# Patient Record
Sex: Female | Born: 1965
Health system: Southern US, Community
[De-identification: ages and names within clinical notes are randomized; demographics above are authoritative.]

## PROBLEM LIST (undated history)

## (undated) DIAGNOSIS — R112 Nausea with vomiting, unspecified: Secondary | ICD-10-CM

## (undated) DIAGNOSIS — N189 Chronic kidney disease, unspecified: Secondary | ICD-10-CM

## (undated) DIAGNOSIS — M810 Age-related osteoporosis without current pathological fracture: Secondary | ICD-10-CM

## (undated) DIAGNOSIS — K649 Unspecified hemorrhoids: Secondary | ICD-10-CM

## (undated) DIAGNOSIS — R350 Frequency of micturition: Secondary | ICD-10-CM

## (undated) DIAGNOSIS — R895 Abnormal microbiological findings in specimens from other organs, systems and tissues: Secondary | ICD-10-CM

## (undated) DIAGNOSIS — M329 Systemic lupus erythematosus, unspecified: Secondary | ICD-10-CM

## (undated) DIAGNOSIS — S42309A Unspecified fracture of shaft of humerus, unspecified arm, initial encounter for closed fracture: Secondary | ICD-10-CM

## (undated) DIAGNOSIS — K219 Gastro-esophageal reflux disease without esophagitis: Secondary | ICD-10-CM

## (undated) DIAGNOSIS — R351 Nocturia: Secondary | ICD-10-CM

## (undated) DIAGNOSIS — N3010 Interstitial cystitis (chronic) without hematuria: Secondary | ICD-10-CM

## (undated) DIAGNOSIS — E236 Other disorders of pituitary gland: Secondary | ICD-10-CM

## (undated) DIAGNOSIS — Z9889 Other specified postprocedural states: Secondary | ICD-10-CM

## (undated) DIAGNOSIS — R3129 Other microscopic hematuria: Secondary | ICD-10-CM

## (undated) DIAGNOSIS — M8580 Other specified disorders of bone density and structure, unspecified site: Secondary | ICD-10-CM

## (undated) DIAGNOSIS — IMO0002 Reserved for concepts with insufficient information to code with codable children: Secondary | ICD-10-CM

## (undated) HISTORY — PX: OVARIAN CYST DRAINAGE: SHX325

## (undated) HISTORY — PX: OTHER SURGICAL HISTORY: SHX169

## (undated) HISTORY — PX: COSMETIC SURGERY: SHX468

## (undated) HISTORY — DX: Interstitial cystitis (chronic) without hematuria: N30.10

## (undated) HISTORY — DX: Age-related osteoporosis without current pathological fracture: M81.0

## (undated) HISTORY — DX: Abnormal microbiological findings in specimens from other organs, systems and tissues: R89.5

## (undated) HISTORY — PX: ABDOMINAL HYSTERECTOMY: SHX81

## (undated) HISTORY — DX: Systemic lupus erythematosus, unspecified: M32.9

---

## 1997-09-20 ENCOUNTER — Ambulatory Visit (HOSPITAL_COMMUNITY): Admission: RE | Admit: 1997-09-20 | Discharge: 1997-09-20 | Payer: Self-pay | Admitting: Neurosurgery

## 1998-09-20 ENCOUNTER — Ambulatory Visit (HOSPITAL_COMMUNITY): Admission: RE | Admit: 1998-09-20 | Discharge: 1998-09-20 | Payer: Self-pay | Admitting: Neurosurgery

## 1998-09-20 ENCOUNTER — Encounter: Payer: Self-pay | Admitting: Neurosurgery

## 1999-09-17 ENCOUNTER — Encounter: Payer: Self-pay | Admitting: Neurosurgery

## 1999-09-17 ENCOUNTER — Ambulatory Visit (HOSPITAL_COMMUNITY): Admission: RE | Admit: 1999-09-17 | Discharge: 1999-09-17 | Payer: Self-pay | Admitting: Neurosurgery

## 2000-09-20 ENCOUNTER — Encounter: Payer: Self-pay | Admitting: Neurosurgery

## 2000-09-20 ENCOUNTER — Ambulatory Visit (HOSPITAL_COMMUNITY): Admission: RE | Admit: 2000-09-20 | Discharge: 2000-09-20 | Payer: Self-pay | Admitting: Neurosurgery

## 2002-09-24 ENCOUNTER — Ambulatory Visit (HOSPITAL_COMMUNITY): Admission: RE | Admit: 2002-09-24 | Discharge: 2002-09-24 | Payer: Self-pay | Admitting: Neurosurgery

## 2002-09-24 ENCOUNTER — Encounter: Payer: Self-pay | Admitting: Neurosurgery

## 2003-10-19 ENCOUNTER — Ambulatory Visit (HOSPITAL_COMMUNITY): Admission: RE | Admit: 2003-10-19 | Discharge: 2003-10-19 | Payer: Self-pay | Admitting: Neurosurgery

## 2004-10-19 ENCOUNTER — Ambulatory Visit (HOSPITAL_COMMUNITY): Admission: RE | Admit: 2004-10-19 | Discharge: 2004-10-19 | Payer: Self-pay | Admitting: Neurosurgery

## 2005-03-23 ENCOUNTER — Ambulatory Visit (HOSPITAL_COMMUNITY): Admission: RE | Admit: 2005-03-23 | Discharge: 2005-03-23 | Payer: Self-pay | Admitting: Pulmonary Disease

## 2005-10-21 ENCOUNTER — Ambulatory Visit (HOSPITAL_COMMUNITY): Admission: RE | Admit: 2005-10-21 | Discharge: 2005-10-21 | Payer: Self-pay | Admitting: Neurosurgery

## 2006-03-07 ENCOUNTER — Ambulatory Visit (HOSPITAL_COMMUNITY): Admission: RE | Admit: 2006-03-07 | Discharge: 2006-03-07 | Payer: Self-pay | Admitting: Obstetrics and Gynecology

## 2006-03-14 ENCOUNTER — Encounter (HOSPITAL_COMMUNITY): Admission: RE | Admit: 2006-03-14 | Discharge: 2006-04-13 | Payer: Self-pay | Admitting: Pulmonary Disease

## 2006-03-14 ENCOUNTER — Ambulatory Visit (HOSPITAL_COMMUNITY): Payer: Self-pay | Admitting: Pulmonary Disease

## 2006-10-27 ENCOUNTER — Ambulatory Visit (HOSPITAL_COMMUNITY): Admission: RE | Admit: 2006-10-27 | Discharge: 2006-10-27 | Payer: Self-pay | Admitting: Neurosurgery

## 2007-02-16 ENCOUNTER — Other Ambulatory Visit: Admission: RE | Admit: 2007-02-16 | Discharge: 2007-02-16 | Payer: Self-pay | Admitting: Obstetrics and Gynecology

## 2007-11-10 ENCOUNTER — Ambulatory Visit (HOSPITAL_COMMUNITY): Admission: RE | Admit: 2007-11-10 | Discharge: 2007-11-10 | Payer: Self-pay | Admitting: Obstetrics and Gynecology

## 2008-05-31 ENCOUNTER — Other Ambulatory Visit: Admission: RE | Admit: 2008-05-31 | Discharge: 2008-05-31 | Payer: Self-pay | Admitting: Obstetrics and Gynecology

## 2008-11-18 ENCOUNTER — Emergency Department (HOSPITAL_COMMUNITY): Admission: EM | Admit: 2008-11-18 | Discharge: 2008-11-18 | Payer: Self-pay | Admitting: Emergency Medicine

## 2008-12-05 ENCOUNTER — Ambulatory Visit: Payer: Self-pay | Admitting: Gastroenterology

## 2008-12-05 DIAGNOSIS — R141 Gas pain: Secondary | ICD-10-CM | POA: Insufficient documentation

## 2008-12-05 DIAGNOSIS — R142 Eructation: Secondary | ICD-10-CM

## 2008-12-05 DIAGNOSIS — D72819 Decreased white blood cell count, unspecified: Secondary | ICD-10-CM | POA: Insufficient documentation

## 2008-12-05 DIAGNOSIS — R6881 Early satiety: Secondary | ICD-10-CM | POA: Insufficient documentation

## 2008-12-05 DIAGNOSIS — D649 Anemia, unspecified: Secondary | ICD-10-CM | POA: Insufficient documentation

## 2008-12-05 DIAGNOSIS — R634 Abnormal weight loss: Secondary | ICD-10-CM | POA: Insufficient documentation

## 2008-12-05 DIAGNOSIS — R143 Flatulence: Secondary | ICD-10-CM

## 2008-12-06 ENCOUNTER — Encounter: Payer: Self-pay | Admitting: Gastroenterology

## 2008-12-07 ENCOUNTER — Encounter: Payer: Self-pay | Admitting: Gastroenterology

## 2008-12-07 LAB — CONVERTED CEMR LAB
Iron: 117 ug/dL (ref 42–145)
Saturation Ratios: 30 % (ref 20–55)
TIBC: 388 ug/dL (ref 250–470)

## 2008-12-09 ENCOUNTER — Ambulatory Visit (HOSPITAL_COMMUNITY): Admission: RE | Admit: 2008-12-09 | Discharge: 2008-12-09 | Payer: Self-pay | Admitting: Gastroenterology

## 2008-12-09 ENCOUNTER — Encounter: Payer: Self-pay | Admitting: Gastroenterology

## 2008-12-09 ENCOUNTER — Ambulatory Visit: Payer: Self-pay | Admitting: Gastroenterology

## 2008-12-13 ENCOUNTER — Encounter: Payer: Self-pay | Admitting: Gastroenterology

## 2008-12-16 ENCOUNTER — Ambulatory Visit (HOSPITAL_COMMUNITY): Admission: RE | Admit: 2008-12-16 | Discharge: 2008-12-16 | Payer: Self-pay | Admitting: Gastroenterology

## 2008-12-19 ENCOUNTER — Ambulatory Visit (HOSPITAL_COMMUNITY): Admission: RE | Admit: 2008-12-19 | Discharge: 2008-12-19 | Payer: Self-pay | Admitting: Gastroenterology

## 2009-01-11 LAB — CONVERTED CEMR LAB
Eosinophils Absolute: 0.1 10*3/uL (ref 0.0–0.7)
Eosinophils Relative: 2 % (ref 0–5)
Hemoglobin: 11.6 g/dL — ABNORMAL LOW (ref 12.0–15.0)
Lymphocytes Relative: 25 % (ref 12–46)
Lymphs Abs: 0.7 10*3/uL (ref 0.7–4.0)
MCHC: 34.7 g/dL (ref 30.0–36.0)
MCV: 92.8 fL (ref 78.0–100.0)
Monocytes Absolute: 0.5 10*3/uL (ref 0.1–1.0)
Neutrophils Relative %: 57 % (ref 43–77)
Platelets: 154 10*3/uL (ref 150–400)
RBC: 3.6 M/uL — ABNORMAL LOW (ref 3.87–5.11)
WBC: 2.8 10*3/uL — ABNORMAL LOW (ref 4.0–10.5)

## 2009-06-14 ENCOUNTER — Encounter: Payer: Self-pay | Admitting: Gastroenterology

## 2009-08-09 ENCOUNTER — Ambulatory Visit (HOSPITAL_COMMUNITY): Admission: RE | Admit: 2009-08-09 | Discharge: 2009-08-09 | Payer: Self-pay | Admitting: Pulmonary Disease

## 2009-10-26 ENCOUNTER — Ambulatory Visit (HOSPITAL_COMMUNITY): Admission: RE | Admit: 2009-10-26 | Discharge: 2009-10-26 | Payer: Self-pay | Admitting: Obstetrics and Gynecology

## 2010-01-09 ENCOUNTER — Encounter: Payer: Self-pay | Admitting: Obstetrics and Gynecology

## 2010-01-09 ENCOUNTER — Ambulatory Visit (HOSPITAL_COMMUNITY)
Admission: RE | Admit: 2010-01-09 | Discharge: 2010-01-10 | Payer: Self-pay | Source: Home / Self Care | Admitting: Obstetrics and Gynecology

## 2010-03-13 NOTE — Letter (Signed)
Summary: Internal Other  Internal Other   Imported By: Peggyann Shoals 06/14/2009 10:18:57  _____________________________________________________________________  External Attachment:    Type:   Image     Comment:   External Document

## 2010-04-24 LAB — BASIC METABOLIC PANEL
CO2: 23 mEq/L (ref 19–32)
Calcium: 8.1 mg/dL — ABNORMAL LOW (ref 8.4–10.5)
GFR calc Af Amer: >60 mL/min (ref >60)
Potassium: 4.3 mEq/L (ref 3.5–5.1)
Sodium: 138 mEq/L (ref 135–145)

## 2010-04-24 LAB — CBC
HCT: 34.6 % — ABNORMAL LOW (ref 36.0–46.0)
HCT: 38.8 % (ref 36.0–46.0)
Hemoglobin: 11.9 g/dL — ABNORMAL LOW (ref 12.0–15.0)
Hemoglobin: 13.6 g/dL (ref 12.0–15.0)
MCH: 32.4 pg (ref 26.0–34.0)
MCH: 32.9 pg (ref 26.0–34.0)
MCV: 93.7 fL (ref 78.0–100.0)
RBC: 4.14 MIL/uL (ref 3.87–5.11)
RDW: 12.5 % (ref 11.5–15.5)

## 2010-04-24 LAB — TYPE AND SCREEN
Antibody Screen: POSITIVE
DAT, IgG: POSITIVE
Unit division: 0

## 2010-04-24 LAB — COMPREHENSIVE METABOLIC PANEL
Albumin: 4.3 g/dL (ref 3.5–5.2)
Alkaline Phosphatase: 40 U/L (ref 39–117)
CO2: 29 mEq/L (ref 19–32)
Creatinine, Ser: 0.69 mg/dL (ref 0.4–1.2)
GFR calc Af Amer: >60 mL/min (ref >60)
GFR calc non Af Amer: >60 mL/min (ref >60)
Glucose, Bld: 80 mg/dL (ref 70–99)
Total Bilirubin: 0.7 mg/dL (ref 0.3–1.2)
Total Protein: 7.6 g/dL (ref 6.0–8.3)

## 2010-04-24 LAB — DIFFERENTIAL
Basophils Absolute: 0 10*3/uL (ref 0.0–0.1)
Basophils Relative: 0 % (ref 0–1)
Lymphocytes Relative: 7 % — ABNORMAL LOW (ref 12–46)
Neutrophils Relative %: 83 % — ABNORMAL HIGH (ref 43–77)

## 2010-04-24 LAB — SURGICAL PCR SCREEN: MRSA, PCR: NEGATIVE

## 2010-05-17 LAB — DIFFERENTIAL
Basophils Relative: 1 % (ref 0–1)
Eosinophils Absolute: 0 10*3/uL (ref 0.0–0.7)
Eosinophils Relative: 1 % (ref 0–5)
Lymphocytes Relative: 23 % (ref 12–46)
Monocytes Absolute: 0.3 10*3/uL (ref 0.1–1.0)

## 2010-05-17 LAB — COMPREHENSIVE METABOLIC PANEL
AST: 23 U/L (ref 0–37)
Albumin: 4.5 g/dL (ref 3.5–5.2)
BUN: 5 mg/dL — ABNORMAL LOW (ref 6–23)
CO2: 26 mEq/L (ref 19–32)
Calcium: 9.5 mg/dL (ref 8.4–10.5)
Creatinine, Ser: 0.59 mg/dL (ref 0.4–1.2)
GFR calc non Af Amer: >60 mL/min (ref >60)
Glucose, Bld: 88 mg/dL (ref 70–99)
Potassium: 3.8 mEq/L (ref 3.5–5.1)

## 2010-05-17 LAB — URINALYSIS, ROUTINE W REFLEX MICROSCOPIC
Hgb urine dipstick: NEGATIVE
Leukocytes, UA: NEGATIVE
Protein, ur: NEGATIVE mg/dL
Urobilinogen, UA: 1 mg/dL (ref 0.0–1.0)

## 2010-05-17 LAB — URINE MICROSCOPIC-ADD ON

## 2010-05-17 LAB — CBC
Hemoglobin: 10.9 g/dL — ABNORMAL LOW (ref 12.0–15.0)
Platelets: 157 10*3/uL (ref 150–400)
RBC: 3.28 MIL/uL — ABNORMAL LOW (ref 3.87–5.11)
WBC: 3.5 10*3/uL — ABNORMAL LOW (ref 4.0–10.5)

## 2010-05-17 LAB — PREGNANCY, URINE: Preg Test, Ur: NEGATIVE

## 2010-05-17 LAB — LIPASE, BLOOD: Lipase: 28 U/L (ref 11–59)

## 2010-05-17 LAB — URINE CULTURE

## 2011-10-21 ENCOUNTER — Other Ambulatory Visit: Payer: Self-pay | Admitting: Obstetrics and Gynecology

## 2011-10-21 DIAGNOSIS — Z139 Encounter for screening, unspecified: Secondary | ICD-10-CM

## 2011-10-24 ENCOUNTER — Ambulatory Visit (HOSPITAL_COMMUNITY)
Admission: RE | Admit: 2011-10-24 | Discharge: 2011-10-24 | Disposition: A | Discharge status: Home / Self Care | Payer: 59 | Source: Ambulatory Visit | Attending: Obstetrics and Gynecology | Admitting: Obstetrics and Gynecology

## 2011-10-24 DIAGNOSIS — Z139 Encounter for screening, unspecified: Secondary | ICD-10-CM

## 2011-10-24 DIAGNOSIS — Z1231 Encounter for screening mammogram for malignant neoplasm of breast: Secondary | ICD-10-CM | POA: Insufficient documentation

## 2011-11-27 ENCOUNTER — Other Ambulatory Visit (HOSPITAL_COMMUNITY): Payer: Self-pay | Admitting: Neurosurgery

## 2011-11-27 DIAGNOSIS — D497 Neoplasm of unspecified behavior of endocrine glands and other parts of nervous system: Secondary | ICD-10-CM

## 2011-12-02 ENCOUNTER — Ambulatory Visit (HOSPITAL_COMMUNITY)
Admission: RE | Admit: 2011-12-02 | Discharge: 2011-12-02 | Disposition: A | Discharge status: Home / Self Care | Payer: 59 | Source: Ambulatory Visit | Attending: Neurosurgery | Admitting: Neurosurgery

## 2011-12-02 DIAGNOSIS — D497 Neoplasm of unspecified behavior of endocrine glands and other parts of nervous system: Secondary | ICD-10-CM | POA: Insufficient documentation

## 2011-12-02 MED ORDER — GADOBENATE DIMEGLUMINE 529 MG/ML IV SOLN
5.0000 mL | Freq: Once | INTRAVENOUS | Status: AC | PRN
  Administered 2011-12-02: 5 mL via INTRAVENOUS

## 2012-12-21 ENCOUNTER — Emergency Department (HOSPITAL_COMMUNITY): Payer: 59

## 2012-12-21 ENCOUNTER — Emergency Department (HOSPITAL_COMMUNITY)
Admission: EM | Admit: 2012-12-21 | Discharge: 2012-12-21 | Disposition: A | Discharge status: Home / Self Care | Payer: 59 | Attending: Emergency Medicine | Admitting: Emergency Medicine

## 2012-12-21 ENCOUNTER — Encounter (HOSPITAL_COMMUNITY): Payer: Self-pay | Admitting: Emergency Medicine

## 2012-12-21 DIAGNOSIS — S5292XA Unspecified fracture of left forearm, initial encounter for closed fracture: Secondary | ICD-10-CM

## 2012-12-21 DIAGNOSIS — IMO0002 Reserved for concepts with insufficient information to code with codable children: Secondary | ICD-10-CM | POA: Insufficient documentation

## 2012-12-21 DIAGNOSIS — Y9289 Other specified places as the place of occurrence of the external cause: Secondary | ICD-10-CM | POA: Insufficient documentation

## 2012-12-21 DIAGNOSIS — Z79899 Other long term (current) drug therapy: Secondary | ICD-10-CM | POA: Insufficient documentation

## 2012-12-21 DIAGNOSIS — M899 Disorder of bone, unspecified: Secondary | ICD-10-CM | POA: Insufficient documentation

## 2012-12-21 DIAGNOSIS — Z880 Allergy status to penicillin: Secondary | ICD-10-CM | POA: Insufficient documentation

## 2012-12-21 DIAGNOSIS — W010XXA Fall on same level from slipping, tripping and stumbling without subsequent striking against object, initial encounter: Secondary | ICD-10-CM | POA: Insufficient documentation

## 2012-12-21 DIAGNOSIS — S52599A Other fractures of lower end of unspecified radius, initial encounter for closed fracture: Secondary | ICD-10-CM | POA: Insufficient documentation

## 2012-12-21 DIAGNOSIS — S79919A Unspecified injury of unspecified hip, initial encounter: Secondary | ICD-10-CM | POA: Insufficient documentation

## 2012-12-21 DIAGNOSIS — Y9301 Activity, walking, marching and hiking: Secondary | ICD-10-CM | POA: Insufficient documentation

## 2012-12-21 DIAGNOSIS — S79929A Unspecified injury of unspecified thigh, initial encounter: Secondary | ICD-10-CM | POA: Insufficient documentation

## 2012-12-21 HISTORY — DX: Systemic lupus erythematosus, unspecified: M32.9

## 2012-12-21 HISTORY — DX: Reserved for concepts with insufficient information to code with codable children: IMO0002

## 2012-12-21 HISTORY — DX: Other specified disorders of bone density and structure, unspecified site: M85.80

## 2012-12-21 MED ORDER — HYDROCODONE-ACETAMINOPHEN 5-325 MG PO TABS: 1.0000 | ORAL_TABLET | ORAL | Status: DC | PRN

## 2012-12-21 NOTE — ED Notes (Signed)
Fall when walking her dog. Pain lt arm esp at wrist. No LOC.  Pain lt hip also

## 2012-12-22 ENCOUNTER — Encounter: Payer: Self-pay | Admitting: Orthopedic Surgery

## 2012-12-22 ENCOUNTER — Ambulatory Visit (INDEPENDENT_AMBULATORY_CARE_PROVIDER_SITE_OTHER): Payer: 59 | Admitting: Orthopedic Surgery

## 2012-12-22 VITALS — BP 104/67 | Ht 62.0 in | Wt 104.0 lb

## 2012-12-22 DIAGNOSIS — S52539A Colles' fracture of unspecified radius, initial encounter for closed fracture: Secondary | ICD-10-CM

## 2012-12-22 DIAGNOSIS — S52532A Colles' fracture of left radius, initial encounter for closed fracture: Secondary | ICD-10-CM | POA: Insufficient documentation

## 2012-12-22 HISTORY — DX: Colles' fracture of left radius, initial encounter for closed fracture: S52.532A

## 2012-12-22 MED ORDER — HYDROCODONE-ACETAMINOPHEN 5-325 MG PO TABS: 1.0000 | ORAL_TABLET | ORAL | Status: DC | PRN

## 2012-12-22 NOTE — Patient Instructions (Signed)
Keep  Cast dry   Do not get wet   If it gets wet dry with a hair dryer on low setting and call the office ]  Return to work note

## 2012-12-23 ENCOUNTER — Encounter: Payer: Self-pay | Admitting: Orthopedic Surgery

## 2012-12-23 NOTE — ED Provider Notes (Signed)
CSN: [id number]     Arrival date & time 12/21/12  1232 History   First MD Initiated Contact with Patient 12/21/12 1356     Chief Complaint  Patient presents with  . Fall   (Consider location/radiation/quality/duration/timing/severity/associated sxs/prior Treatment) HPI Comments: Kara Bradley is a 47 y.o. Female presenting with pain in her left wrist after tripping backwards while walking her dog just prior to arrival today.  She landed on her buttocks but caught her left hand behind her on the sidewalk.  She does have complaint of mild soreness of her left buttock and hip area, but denies pain with weight bearing.  She denies numbness in her left hand or fingers and can make a fist with minimal discomfort.  She denies other injury including no pain in the elbow or shoulder.  Pain is improved with rest and the ice given since her arrival.       The history is provided by the patient and the spouse.    Past Medical History  Diagnosis Date  . Lupus   . Osteopenia    Past Surgical History  Procedure Laterality Date  . Abdominal hysterectomy    . Ovarian cyst drainage    . Eye surgery     History reviewed. No pertinent family history. History  Substance Use Topics  . Smoking status: Never Smoker   . Smokeless tobacco: Not on file  . Alcohol Use: Yes     Comment: rarely   OB History   Grav Para Term Preterm Abortions TAB SAB Ect Mult Living                 Review of Systems  Constitutional: Negative for fever.  Musculoskeletal: Positive for arthralgias. Negative for joint swelling and myalgias.  Neurological: Negative for weakness and numbness.    Allergies  Cephalexin; Penicillins; and Sulfamethoxazole-trimethoprim  Home Medications   Current Outpatient Rx  Name  Route  Sig  Dispense  Refill  . calcium-vitamin D (OSCAL WITH D) 500-200 MG-UNIT per tablet   Oral   Take 1 tablet by mouth daily.         . clobetasol cream (TEMOVATE) 0.05 %   Topical   Apply  1 application topically daily.         . fluticasone (CUTIVATE) 0.005 % ointment   Topical   Apply 1 application topically daily.         . hydroxychloroquine (PLAQUENIL) 200 MG tablet   Oral   Take 200 mg by mouth daily.         Marland Kitchen ibuprofen (ADVIL,MOTRIN) 200 MG tablet   Oral   Take 200 mg by mouth every 6 (six) hours as needed for mild pain.         . Multiple Vitamins-Minerals (MULTIVITAMINS THER. W/MINERALS) TABS tablet   Oral   Take 1 tablet by mouth daily.         . predniSONE (DELTASONE) 5 MG tablet   Oral   Take 5 mg by mouth daily with breakfast.         . Risedronate Sodium (ATELVIA) 35 MG TBEC   Oral   Take 1 tablet by mouth once a week.         Marland Kitchen HYDROcodone-acetaminophen (NORCO/VICODIN) 5-325 MG per tablet   Oral   Take 1 tablet by mouth every 4 (four) hours as needed for moderate pain.   40 tablet   0    BP 134/77  Pulse 83  Temp(Src) 98.1  F (36.7 C) (Oral)  Resp 20  Ht 5\' 2"  (1.575 m)  Wt 104 lb (47.174 kg)  BMI 19.02 kg/m2  SpO2 100% Physical Exam  Constitutional: She appears well-developed and well-nourished.  HENT:  Head: Atraumatic.  Neck: Normal range of motion.  Cardiovascular:  Pulses equal bilaterally  Musculoskeletal: She exhibits tenderness.       Left wrist: She exhibits decreased range of motion, bony tenderness and swelling. She exhibits no effusion, no deformity and no laceration.  ttp across dorsal and volar left wrist. No ecchymosis or appreciable edema at this time.  Distal sensation intact with less than 3 sec cap refill in fingers.  Non tender to palpation proximal forearm, elbow, humerus and shoulder. No clavicle tenderness  Neurological: She is alert. She has normal strength. She displays normal reflexes. No sensory deficit.  Equal strength  Skin: Skin is warm and dry.  Psychiatric: She has a normal mood and affect.    ED Course  Procedures (including critical care time) Labs Review Labs Reviewed - No data  to display Imaging Review Dg Wrist Complete Right  12/21/2012   CLINICAL DATA:  Fall. Wrist pain.  EXAM: RIGHT WRIST - COMPLETE 3+ VIEW  COMPARISON:  None.  FINDINGS: Comminuted fracture of the distal radius is identified with extension to the articular surface. No associated distal ulnar fracture is evident. Bony alignment in the carpus is preserved.  IMPRESSION: Comminuted distal radius fracture with intra-articular extension.   Electronically Signed   By: Kennith Center M.D.   On: 12/21/2012 14:08    EKG Interpretation   None       MDM   1. Radial fracture, left, closed, initial encounter    Patients labs and/or radiological studies were viewed and considered during the medical decision making and disposition process. Spoke with Dr. Romeo Apple regarding pt's injury.  Will f/u as outpatient,  Patient will see him in office tomorrow.  She was placed in a sugar tong,  Sling given. Encouraged ice,  Elevation.  Prescribed hydrocodone prn.  Cap refill normal after splint applied.       Burgess Amor, PA-C 12/23/12 [phone]

## 2012-12-23 NOTE — Progress Notes (Signed)
Patient ID: Kara Bradley, female   DOB: 09-07-1965, 47 y.o.   MRN: [id number]  Chief Complaint  Patient presents with  . Wrist Pain    Left wrist fracture d/t injury 12/21/12   Left wrist fracture date of injury November 10 Fall onto left side including left buttock with some left gluteal pain radiating to the upper left leg Medication Advil hydrocodone History of osteopenia Pain description: Dull, throbbing, constant, 4/10. Associated symptoms swelling   review of systems easy bruising cold intolerance seasonal allergies otherwise normal  BP 104/67  Ht 5\' 2"  (1.575 m)  Wt 104 lb (47.174 kg)  BMI 19.02 kg/m2 General appearance is normal, the patient is alert and oriented x3 with normal mood and affect. She has a very small frame she weighed approximately 100 pounds her ambulation is normal. The wrist is not deformed is tender swollen her range of motion is diminished her stability is confirmed by x-ray and appearance muscle tone is normal skin is intact pulses good at the radial artery color is good and sensation is normal  The x-ray shows a nondisplaced non-angulated distal radius fracture  Impression distal radius fracture left wrist nondisplaced  Recommend short arm cast for 6 weeks

## 2012-12-24 NOTE — ED Provider Notes (Signed)
Medical screening examination/treatment/procedure(s) were performed by non-physician practitioner and as supervising physician I was immediately available for consultation/collaboration.  EKG Interpretation   None        Bhavika Schnider, MD 12/24/12 1042 

## 2013-02-09 ENCOUNTER — Encounter: Payer: Self-pay | Admitting: Orthopedic Surgery

## 2013-02-09 ENCOUNTER — Ambulatory Visit (INDEPENDENT_AMBULATORY_CARE_PROVIDER_SITE_OTHER): Payer: 59 | Admitting: Orthopedic Surgery

## 2013-02-09 ENCOUNTER — Ambulatory Visit (INDEPENDENT_AMBULATORY_CARE_PROVIDER_SITE_OTHER): Payer: 59

## 2013-02-09 VITALS — BP 113/66 | Ht 62.0 in | Wt 104.0 lb

## 2013-02-09 DIAGNOSIS — S62109A Fracture of unspecified carpal bone, unspecified wrist, initial encounter for closed fracture: Secondary | ICD-10-CM | POA: Insufficient documentation

## 2013-02-09 DIAGNOSIS — S62102D Fracture of unspecified carpal bone, left wrist, subsequent encounter for fracture with routine healing: Secondary | ICD-10-CM

## 2013-02-09 DIAGNOSIS — S5290XD Unspecified fracture of unspecified forearm, subsequent encounter for closed fracture with routine healing: Secondary | ICD-10-CM

## 2013-02-09 NOTE — Patient Instructions (Signed)
activities as tolerated 

## 2013-02-09 NOTE — Progress Notes (Signed)
Patient ID: Kara Bradley, female   DOB: 1966-02-12, 47 y.o.   MRN: [id number]  Chief Complaint  Patient presents with  . Follow-up    6 week recheck left wrist fracture DOI 12/21/12    BP 113/66  Ht 5\' 2"  (1.575 m)  Wt 104 lb (47.174 kg)  BMI 19.02 kg/m2  Encounter Diagnosis  Name Primary?  . Wrist fracture, left, with routine healing, subsequent encounter Yes    Followup x-ray left distal radius fracture nondisplaced treated with short arm cast for approximately 6 weeks  The x-ray shows fracture healing in the clinical exam is normal other than routine stiffness at the wrist joint  Recommend gradual return to normal activities followup if problems still occurring after 2 weeks

## 2013-06-03 ENCOUNTER — Ambulatory Visit (INDEPENDENT_AMBULATORY_CARE_PROVIDER_SITE_OTHER): Payer: 59 | Admitting: Otolaryngology

## 2013-06-03 DIAGNOSIS — H6980 Other specified disorders of Eustachian tube, unspecified ear: Secondary | ICD-10-CM

## 2013-06-03 DIAGNOSIS — H93299 Other abnormal auditory perceptions, unspecified ear: Secondary | ICD-10-CM

## 2013-06-03 DIAGNOSIS — J310 Chronic rhinitis: Secondary | ICD-10-CM

## 2013-10-07 ENCOUNTER — Other Ambulatory Visit (HOSPITAL_COMMUNITY): Payer: Self-pay | Admitting: Pulmonary Disease

## 2013-10-07 ENCOUNTER — Ambulatory Visit (HOSPITAL_COMMUNITY)
Admission: RE | Admit: 2013-10-07 | Discharge: 2013-10-07 | Disposition: A | Discharge status: Home / Self Care | Payer: 59 | Source: Ambulatory Visit | Attending: Pulmonary Disease | Admitting: Pulmonary Disease

## 2013-10-07 DIAGNOSIS — W19XXXA Unspecified fall, initial encounter: Secondary | ICD-10-CM

## 2013-10-07 DIAGNOSIS — M25572 Pain in left ankle and joints of left foot: Secondary | ICD-10-CM

## 2013-10-07 DIAGNOSIS — M25579 Pain in unspecified ankle and joints of unspecified foot: Secondary | ICD-10-CM | POA: Diagnosis present

## 2013-10-13 ENCOUNTER — Ambulatory Visit (INDEPENDENT_AMBULATORY_CARE_PROVIDER_SITE_OTHER): Payer: 59 | Admitting: Obstetrics and Gynecology

## 2013-10-13 ENCOUNTER — Encounter: Payer: Self-pay | Admitting: Obstetrics and Gynecology

## 2013-10-13 VITALS — BP 110/68 | Ht 62.0 in | Wt 103.0 lb

## 2013-10-13 DIAGNOSIS — Z1211 Encounter for screening for malignant neoplasm of colon: Secondary | ICD-10-CM | POA: Insufficient documentation

## 2013-10-13 DIAGNOSIS — Z01419 Encounter for gynecological examination (general) (routine) without abnormal findings: Secondary | ICD-10-CM

## 2013-10-13 DIAGNOSIS — Z0000 Encounter for general adult medical examination without abnormal findings: Secondary | ICD-10-CM
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
## 2013-10-27 ENCOUNTER — Other Ambulatory Visit: Payer: Self-pay | Admitting: Obstetrics and Gynecology

## 2013-10-27 DIAGNOSIS — Z139 Encounter for screening, unspecified: Secondary | ICD-10-CM

## 2013-11-01 ENCOUNTER — Ambulatory Visit (HOSPITAL_COMMUNITY)
Admission: RE | Admit: 2013-11-01 | Discharge: 2013-11-01 | Disposition: A | Discharge status: Home / Self Care | Payer: 59 | Source: Ambulatory Visit | Attending: Obstetrics and Gynecology | Admitting: Obstetrics and Gynecology

## 2013-11-01 DIAGNOSIS — Z1231 Encounter for screening mammogram for malignant neoplasm of breast: Secondary | ICD-10-CM | POA: Insufficient documentation

## 2013-11-01 DIAGNOSIS — Z139 Encounter for screening, unspecified: Secondary | ICD-10-CM

## 2013-11-05 ENCOUNTER — Other Ambulatory Visit: Payer: Self-pay | Admitting: Obstetrics and Gynecology

## 2013-11-05 DIAGNOSIS — R928 Other abnormal and inconclusive findings on diagnostic imaging of breast: Secondary | ICD-10-CM

## 2013-11-08 ENCOUNTER — Other Ambulatory Visit: Payer: Self-pay | Admitting: Obstetrics and Gynecology

## 2013-11-11 ENCOUNTER — Other Ambulatory Visit: Payer: Self-pay | Admitting: Obstetrics and Gynecology

## 2013-11-11 ENCOUNTER — Other Ambulatory Visit: Payer: Self-pay

## 2013-11-11 DIAGNOSIS — R928 Other abnormal and inconclusive findings on diagnostic imaging of breast: Secondary | ICD-10-CM

## 2013-11-18 ENCOUNTER — Ambulatory Visit
Admission: RE | Admit: 2013-11-18 | Discharge: 2013-11-18 | Disposition: A | Discharge status: Home / Self Care | Payer: 59 | Source: Ambulatory Visit | Attending: Obstetrics and Gynecology | Admitting: Obstetrics and Gynecology

## 2013-11-18 DIAGNOSIS — R928 Other abnormal and inconclusive findings on diagnostic imaging of breast: Secondary | ICD-10-CM

## 2014-05-17 ENCOUNTER — Encounter: Payer: Self-pay | Admitting: Obstetrics and Gynecology

## 2014-05-17 ENCOUNTER — Ambulatory Visit (INDEPENDENT_AMBULATORY_CARE_PROVIDER_SITE_OTHER): Payer: 59 | Admitting: Obstetrics and Gynecology

## 2014-05-17 VITALS — BP 100/62 | Ht 62.0 in | Wt 101.0 lb

## 2014-05-17 DIAGNOSIS — R1031 Right lower quadrant pain: Secondary | ICD-10-CM
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
## 2014-05-18 ENCOUNTER — Telehealth: Payer: Self-pay | Admitting: Obstetrics and Gynecology

## 2014-05-18 LAB — CBC WITH DIFFERENTIAL/PLATELET
Basophils Absolute: 0 10*3/uL (ref 0.0–0.2)
Basos: 1 %
EOS: 0 %
Eosinophils Absolute: 0 10*3/uL (ref 0.0–0.4)
HCT: 40.2 % (ref 34.0–46.6)
Hemoglobin: 13.3 g/dL (ref 11.1–15.9)
IMMATURE GRANS (ABS): 0 10*3/uL (ref 0.0–0.1)
Immature Granulocytes: 0 %
LYMPHS ABS: 0.8 10*3/uL (ref 0.7–3.1)
Lymphs: 12 %
MCH: 30.9 pg (ref 26.6–33.0)
MCHC: 33.1 g/dL (ref 31.5–35.7)
MCV: 94 fL (ref 79–97)
MONOCYTES: 6 %
Monocytes Absolute: 0.4 10*3/uL (ref 0.1–0.9)
NEUTROS PCT: 81 %
Neutrophils Absolute: 5.4 10*3/uL (ref 1.4–7.0)
Platelets: 239 10*3/uL (ref 150–379)
RBC: 4.3 x10E6/uL (ref 3.77–5.28)
RDW: 13.1 % (ref 12.3–15.4)
WBC: 6.7 10*3/uL (ref 3.4–10.8)

## 2014-05-18 LAB — SEDIMENTATION RATE: Sed Rate: 2 mm/hr (ref 0–32)

## 2014-05-18 NOTE — Telephone Encounter (Signed)
Labs reviewed with pt. Who is on steroids for Lupus chronically, and has had the ? Of Interstitial Cystitis in the past, has not seen a urologist. Pt has results: Wbc 6.7, slight left shift, but normal ESR of 2, very reassuring Pt with less rlq discomfort today, but sensitive over bladder after bimanual done yesterday. IMP no evidence of acute inflammatory process. Plan:  No tx at present Pt to keep symptom record til followup in 2wk.

## 2014-05-27 ENCOUNTER — Encounter: Payer: Self-pay | Admitting: Obstetrics and Gynecology

## 2014-08-31 ENCOUNTER — Other Ambulatory Visit: Payer: Self-pay | Admitting: Urology

## 2014-09-05 ENCOUNTER — Encounter (HOSPITAL_COMMUNITY)
Admission: RE | Admit: 2014-09-05 | Discharge: 2014-09-05 | Disposition: A | Discharge status: Home / Self Care | Payer: 59 | Source: Ambulatory Visit | Attending: Urology | Admitting: Urology

## 2014-09-05 ENCOUNTER — Encounter (HOSPITAL_COMMUNITY): Payer: Self-pay

## 2014-09-05 DIAGNOSIS — R3989 Other symptoms and signs involving the genitourinary system: Secondary | ICD-10-CM | POA: Diagnosis present

## 2014-09-05 DIAGNOSIS — R312 Other microscopic hematuria: Secondary | ICD-10-CM | POA: Diagnosis present

## 2014-09-05 DIAGNOSIS — K219 Gastro-esophageal reflux disease without esophagitis: Secondary | ICD-10-CM | POA: Diagnosis not present

## 2014-09-05 DIAGNOSIS — Z79899 Other long term (current) drug therapy: Secondary | ICD-10-CM | POA: Diagnosis not present

## 2014-09-05 DIAGNOSIS — N3011 Interstitial cystitis (chronic) with hematuria: Secondary | ICD-10-CM | POA: Diagnosis not present

## 2014-09-05 DIAGNOSIS — Z7951 Long term (current) use of inhaled steroids: Secondary | ICD-10-CM | POA: Diagnosis not present

## 2014-09-05 DIAGNOSIS — Z7952 Long term (current) use of systemic steroids: Secondary | ICD-10-CM | POA: Diagnosis not present

## 2014-09-05 DIAGNOSIS — L932 Other local lupus erythematosus: Secondary | ICD-10-CM | POA: Diagnosis not present

## 2014-09-05 HISTORY — DX: Nocturia: R35.1

## 2014-09-05 HISTORY — DX: Other specified postprocedural states: R11.2

## 2014-09-05 HISTORY — DX: Unspecified fracture of shaft of humerus, unspecified arm, initial encounter for closed fracture: S42.309A

## 2014-09-05 HISTORY — DX: Gastro-esophageal reflux disease without esophagitis: K21.9

## 2014-09-05 HISTORY — DX: Chronic kidney disease, unspecified: N18.9

## 2014-09-05 HISTORY — DX: Frequency of micturition: R35.0

## 2014-09-05 HISTORY — DX: Other microscopic hematuria: R31.29

## 2014-09-05 HISTORY — DX: Unspecified hemorrhoids: K64.9

## 2014-09-05 HISTORY — DX: Other specified postprocedural states: Z98.890

## 2014-09-05 HISTORY — DX: Other disorders of pituitary gland: E23.6

## 2014-09-05 LAB — BASIC METABOLIC PANEL
Anion gap: 5 (ref 5–15)
BUN: 16 mg/dL (ref 6–20)
CALCIUM: 9.5 mg/dL (ref 8.9–10.3)
CO2: 29 mmol/L (ref 22–32)
CREATININE: 0.83 mg/dL (ref 0.44–1.00)
Chloride: 104 mmol/L (ref 101–111)
GFR calc Af Amer: >60 mL/min (ref >60)
Glucose, Bld: 91 mg/dL (ref 65–99)
POTASSIUM: 4.4 mmol/L (ref 3.5–5.1)
Sodium: 138 mmol/L (ref 135–145)

## 2014-09-05 LAB — CBC
HEMATOCRIT: 38.8 % (ref 36.0–46.0)
Hemoglobin: 12.8 g/dL (ref 12.0–15.0)
MCH: 31.5 pg (ref 26.0–34.0)
MCHC: 33 g/dL (ref 30.0–36.0)
MCV: 95.6 fL (ref 78.0–100.0)
PLATELETS: 206 10*3/uL (ref 150–400)
RBC: 4.06 MIL/uL (ref 3.87–5.11)
RDW: 12 % (ref 11.5–15.5)
WBC: 5 10*3/uL (ref 4.0–10.5)

## 2014-09-05 NOTE — H&P (Signed)
Active Problems Problems  1. Bladder pain (R39.89) 2. Microscopic hematuria (R31.2)  History of Present Illness Kara Bradley is a 49 yo WF who is sent in consultation by Dr. Luan Pulling for possible IC. She has had an 8 year history of bladder symptoms. She has pain in the bladder with a full bladder or after dietary irritants. She takes [place] which is similar fosamax and that irritates the bladder. The pain is generally not too bad. It is relieved by voiding. It got worse in April with a UTI. She can have occasional burning. She has pelvic pressure. She has 8-10 voids daily and nocturia 1-2x but it can be more with irritating foods. He will have urgency if she wears tight clothing. She has trace blood on the dip as a rule with 0-2 or 3-6 RBC's. She will have negative cultures or mx species. She has no dysparunia as a rule. She had a hysterectomy in 2011. She is G1P1 with a vaginal delivery. She has had no GU surgery. She has SUI and might have to wear a pad if she has a cold. She has had no stones.   Past Medical History Problems  1. 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 History of Eustachian tube dysfunction (H69.80) 3. History of esophageal reflux (Z87.19) 4. History of hemorrhoids (Z87.19)  Surgical History Problems  1. History of Eye Surgery 2. History of Jaw Surgery  Current Meds 1. Atelvia 35 MG Oral Tablet Delayed Release;  Therapy: (Recorded:19Jul2016) to Recorded 2. Calcium TABS;  Therapy: (Recorded:19Jul2016) to Recorded 3. Claritin TABS;  Therapy: (Recorded:19Jul2016) to Recorded 4. Cutivate CREA;  Therapy: (Recorded:19Jul2016) to Recorded 5. Multi-Vitamin TABS;  Therapy: (Recorded:19Jul2016) to Recorded 6. Nasacort AQ AERS;  Therapy: (Recorded:19Jul2016) to Recorded 7. Plaquenil TABS;  Therapy: (Recorded:19Jul2016) to Recorded 8. PredniSONE 5 MG Oral Tablet;  Therapy: (Recorded:19Jul2016) to Recorded 9. Probiotic CAPS;  Therapy: (Recorded:19Jul2016)  to Recorded 10. Temovate CREA;   Therapy: (Recorded:19Jul2016) to Recorded 11. Vitamin D TABS;   Therapy: (Recorded:19Jul2016) to Recorded  Allergies Medication  1. Keflex CAPS 2. Penicillins 3. Septra TABS  Family History Problems  1. Family history of Colon cancer : Grandmother 2. Family history of diabetes mellitus (Z83.3) : Mother 3. Family history of kidney stones (Z84.1) : Mother 4. Family history of macular degeneration (Z83.518) : Grandfather  Social History Problems    Married   Mother deceased   Never a smoker   No caffeine use   Occasional alcohol use   Occupation   One child  She has cut out caffeine in the last couple of months which may have helped.   Review of Systems Genitourinary, constitutional, skin, eye, otolaryngeal, hematologic/lymphatic, cardiovascular, pulmonary, endocrine, musculoskeletal, gastrointestinal, neurological and psychiatric system(s) were reviewed and pertinent findings if present are noted and are otherwise negative.  Genitourinary: urinary frequency, dysuria, nocturia and hematuria.  Gastrointestinal: heartburn.  Integumentary: skin rash/lesion and pruritus.  ENT: sinus problems.  Hematologic/Lymphatic: a tendency to easily bruise.  Musculoskeletal: joint pain.    Vitals Vital Signs [Data Includes: Last 1 Day]  Recorded: [id number] 09:04AM  Height: 5 ft 2 in Weight: 97 lb  BMI Calculated: 17.74 BSA Calculated: 1.41 Blood Pressure: 104 / 66 Temperature: 98.4 F Heart Rate: 81  Physical Exam Constitutional: Well nourished and well developed . No acute distress.  ENT:. The ears and nose are normal in appearance.  Neck: The appearance of the neck is normal and no neck mass is present.  Pulmonary: No respiratory distress and normal respiratory  rhythm and effort.  Cardiovascular: Heart rate and rhythm are normal . No peripheral edema.  Abdomen: The abdomen is soft and nontender (except for mild SP tenderness). No masses  are palpated. No CVA tenderness. No hernias are palpable. No hepatosplenomegaly noted.  Genitourinary:  Chaperone Present: .  Examination of the external genitalia shows normal female external genitalia and no lesions. The urethra is normal in appearance and not tender. There is no urethral mass. Vaginal exam demonstrates the vaginal epithelium to be poorly estrogenized, but no abnormalities and no atrophy. No cystocele is identified. No rectocele is identified. The cervix is is absent. The uterus is absent. The adnexa are palpably normal. The bladder is tender, but normal on palpation. The anus is normal on inspection. The perineum is normal on inspection.  Lymphatics: The supraclavicular, femoral and inguinal nodes are not enlarged or tender.  Skin: Normal skin turgor, no visible rash and no visible skin lesions.  Neuro/Psych:. Mood and affect are appropriate. Normal sensation of the perineum/perianal region (S3,4,5).    Results/Data Urine [Data Includes: Last 1 Day]   [id number]  COLOR YELLOW   APPEARANCE CLEAR   SPECIFIC GRAVITY <1.005   pH 7.0   GLUCOSE NEG mg/dL  BILIRUBIN NEG   KETONE NEG mg/dL  BLOOD NEG   PROTEIN NEG mg/dL  UROBILINOGEN 0.2 mg/dL  NITRITE NEG   LEUKOCYTE ESTERASE NEG    Old records or history reviewed: Records from Dr. Luan Pulling reviewed with cultures and labs.  the following images/tracing/specimen were independently visualized: 1  Renal US today shows a 9.49cm right kidney with a 6.60mm echogenic focus in the RMP but no masses or hydro. The left kidney is 9.23cm with a 3.78mm echogenic focus in the LMP but no masses or hydro. The bladder is unremarkable with a volume of 11.66ml.1 .  The following clinical lab reports were reviewed:  UA reviewed.     1 Amended By: Irine Seal; Aug 31 2014 5:00 PM EST  Assessment Assessed  1. Bladder pain (R39.89) 2. Microscopic hematuria (R31.2)  She has a long history of frequency, urgency and bladder pain that is consistent  with IC.  She has had microhematuria but a clear urine today.   Plan Bladder pain  1. Pelvic Exam; Status:Hold For - Manual Activation; Requested for:19Jul2016;  Health Maintenance  2. UA With REFLEX; [Do Not Release]; Status:Resulted - Requires Verification;   Done:  [id number] 08:59AM Microscopic hematuria  3. RENAL U/S COMPLETE; Status:Hold For - Appointment,Date of Service; Requested  for:19Jul2016;  4. Follow-up Schedule Surgery Office  Follow-up  Status: Hold For - Appointment   Requested for: [phone]  I discussed the need to evaluate the microhematuria and also the options for treating/evaluating the possible IC including cystoscopy with HOD, El[place], elavil, bladder instillations with local anesthetic and/or DMSO and dietary manipulations.   She would like to proceed with HOD.  I am going to get a renal US today and will do RTG's with the cystoscopy to evaluate the hematuria.  I reviewed the risks of the HOD including bleeding, infection, bladder injury, thrombotic events and anesthetic risks that could be life threatening. She will get pyridium and marcaine with the procedure as well.   Discussion/Summary CC: Dr. Velvet Bathe.   Amendment  Renal US today shows a single small possible stone in each kidney.1     1 Amended By: Irine Seal; Aug 31 2014 4:59 PM EST  Signatures

## 2014-09-05 NOTE — Progress Notes (Addendum)
Pt states unaware was to stop vitamins two weeks prior. Dr Jeffie Pollock aware.

## 2014-09-05 NOTE — Patient Instructions (Signed)
Kara Bradley  09/05/2014   Your procedure is scheduled on: Tuesday September 06, 2014   Report to [facility] Main  Entrance take [place]  elevators to 3rd floor to  [place] at 6:30 AM.  Call this number if you have problems the morning of surgery [phone]   Remember: ONLY 1 PERSON MAY GO WITH YOU TO SHORT STAY TO GET  READY MORNING OF [place].  Do not eat food or drink liquids :After Midnight.     Take these medicines the morning of surgery with A SIP OF WATER: Loratadine (Claritin) if needed; Nasacort nasal spray if needed; Prednisone                               You may not have any metal on your body including hair pins and              piercings  Do not wear jewelry, make-up, lotions, powders or perfumes, deodorant             Do not wear nail polish.  Do not shave  48 hours prior to surgery.                Do not bring valuables to the hospital. [place].  Contacts, dentures or bridgework may not be worn into surgery.     Patients discharged the day of surgery will not be allowed to drive home.  Name and phone number of your driver: Reggy Eye (husband)  _____________________________________________________________________             [facility] - Preparing for Surgery Before surgery, you can play an important role.  Because skin is not sterile, your skin needs to be as free of germs as possible.  You can reduce the number of germs on your skin by washing with CHG (chlorahexidine gluconate) soap before surgery.  CHG is an antiseptic cleaner which kills germs and bonds with the skin to continue killing germs even after washing. Please DO NOT use if you have an allergy to CHG or antibacterial soaps.  If your skin becomes reddened/irritated stop using the CHG and inform your nurse when you arrive at Short Stay. Do not shave (including legs and underarms) for at least 48 hours prior to the  first CHG shower.  You may shave your face/neck. Please follow these instructions carefully:  1.  Shower with CHG Soap the night before surgery and the  morning of Surgery.  2.  If you choose to wash your hair, wash your hair first as usual with your  normal  shampoo.  3.  After you shampoo, rinse your hair and body thoroughly to remove the  shampoo.                           4.  Use CHG as you would any other liquid soap.  You can apply chg directly  to the skin and wash                       Gently with a scrungie or clean washcloth.  5.  Apply the CHG Soap to your body ONLY FROM THE NECK DOWN.  Do not use on face/ open                           Wound or open sores. Avoid contact with eyes, ears mouth and genitals (private parts).                       Wash face,  Genitals (private parts) with your normal soap.             6.  Wash thoroughly, paying special attention to the area where your surgery  will be performed.  7.  Thoroughly rinse your body with warm water from the neck down.  8.  DO NOT shower/wash with your normal soap after using and rinsing off  the CHG Soap.                9.  Pat yourself dry with a clean towel.            10.  Wear clean pajamas.            11.  Place clean sheets on your bed the night of your first shower and do not  sleep with pets. Day of Surgery : Do not apply any lotions/deodorants the morning of surgery.  Please wear clean clothes to the hospital/surgery center.  FAILURE TO FOLLOW THESE INSTRUCTIONS MAY RESULT IN THE CANCELLATION OF YOUR SURGERY PATIENT SIGNATURE_________________________________  NURSE SIGNATURE__________________________________  ________________________________________________________________________

## 2014-09-06 ENCOUNTER — Ambulatory Visit (HOSPITAL_COMMUNITY)
Admission: RE | Admit: 2014-09-06 | Discharge: 2014-09-06 | Disposition: A | Discharge status: Home / Self Care | Payer: 59 | Source: Ambulatory Visit | Attending: Urology | Admitting: Urology

## 2014-09-06 ENCOUNTER — Ambulatory Visit (HOSPITAL_COMMUNITY): Payer: 59

## 2014-09-06 ENCOUNTER — Encounter (HOSPITAL_COMMUNITY)
Admission: RE | Disposition: A | Discharge status: Home / Self Care | Payer: Self-pay | Source: Ambulatory Visit | Attending: Urology

## 2014-09-06 ENCOUNTER — Ambulatory Visit (HOSPITAL_COMMUNITY): Payer: 59 | Admitting: Anesthesiology

## 2014-09-06 ENCOUNTER — Encounter (HOSPITAL_COMMUNITY): Payer: Self-pay | Admitting: *Deleted

## 2014-09-06 DIAGNOSIS — Z79899 Other long term (current) drug therapy: Secondary | ICD-10-CM | POA: Insufficient documentation

## 2014-09-06 DIAGNOSIS — K219 Gastro-esophageal reflux disease without esophagitis: Secondary | ICD-10-CM | POA: Insufficient documentation

## 2014-09-06 DIAGNOSIS — L932 Other local lupus erythematosus: Secondary | ICD-10-CM | POA: Insufficient documentation

## 2014-09-06 DIAGNOSIS — N3011 Interstitial cystitis (chronic) with hematuria: Secondary | ICD-10-CM | POA: Insufficient documentation

## 2014-09-06 DIAGNOSIS — N189 Chronic kidney disease, unspecified: Secondary | ICD-10-CM

## 2014-09-06 DIAGNOSIS — Z7952 Long term (current) use of systemic steroids: Secondary | ICD-10-CM | POA: Insufficient documentation

## 2014-09-06 DIAGNOSIS — Z7951 Long term (current) use of inhaled steroids: Secondary | ICD-10-CM | POA: Insufficient documentation

## 2014-09-06 HISTORY — PX: CYSTO WITH HYDRODISTENSION: SHX5453

## 2014-09-06 SURGERY — CYSTOSCOPY, WITH BLADDER HYDRODISTENSION
Anesthesia: General | Site: Bladder | Laterality: Bilateral

## 2014-09-06 MED ORDER — METOCLOPRAMIDE HCL 5 MG/ML IJ SOLN
INTRAMUSCULAR | Status: AC
  Filled 2014-09-06: qty 2

## 2014-09-06 MED ORDER — LIDOCAINE HCL 2 % EX GEL
CUTANEOUS | Status: AC
  Filled 2014-09-06: qty 10

## 2014-09-06 MED ORDER — PROMETHAZINE HCL 25 MG/ML IJ SOLN: 6.2500 mg | INTRAMUSCULAR | Status: DC | PRN

## 2014-09-06 MED ORDER — MIDAZOLAM HCL 5 MG/5ML IJ SOLN
INTRAMUSCULAR | Status: DC | PRN
  Administered 2014-09-06 (×2): 1 mg via INTRAVENOUS

## 2014-09-06 MED ORDER — BELLADONNA ALKALOIDS-OPIUM 16.2-60 MG RE SUPP
RECTAL | Status: AC
  Filled 2014-09-06: qty 1

## 2014-09-06 MED ORDER — PROPOFOL 10 MG/ML IV BOLUS
INTRAVENOUS | Status: AC
  Filled 2014-09-06: qty 20

## 2014-09-06 MED ORDER — IOHEXOL 300 MG/ML  SOLN
INTRAMUSCULAR | Status: DC | PRN
  Administered 2014-09-06: 18 mL via URETHRAL

## 2014-09-06 MED ORDER — SODIUM CHLORIDE 0.9 % IJ SOLN
INTRAMUSCULAR | Status: AC
  Filled 2014-09-06: qty 10

## 2014-09-06 MED ORDER — FENTANYL CITRATE (PF) 100 MCG/2ML IJ SOLN: 25.0000 ug | INTRAMUSCULAR | Status: DC | PRN

## 2014-09-06 MED ORDER — LIDOCAINE HCL (CARDIAC) 20 MG/ML IV SOLN
INTRAVENOUS | Status: AC
  Filled 2014-09-06: qty 5

## 2014-09-06 MED ORDER — FENTANYL CITRATE (PF) 100 MCG/2ML IJ SOLN
INTRAMUSCULAR | Status: AC
  Filled 2014-09-06: qty 2

## 2014-09-06 MED ORDER — MEPERIDINE HCL 50 MG/ML IJ SOLN: 6.2500 mg | INTRAMUSCULAR | Status: DC | PRN

## 2014-09-06 MED ORDER — HYDROCODONE-ACETAMINOPHEN 5-325 MG PO TABS: 1.0000 | ORAL_TABLET | Freq: Four times a day (QID) | ORAL | Status: DC | PRN

## 2014-09-06 MED ORDER — PROPOFOL 10 MG/ML IV BOLUS
INTRAVENOUS | Status: DC | PRN
  Administered 2014-09-06: 100 mg via INTRAVENOUS
  Administered 2014-09-06: 50 mg via INTRAVENOUS

## 2014-09-06 MED ORDER — EPHEDRINE SULFATE 50 MG/ML IJ SOLN
INTRAMUSCULAR | Status: AC
  Filled 2014-09-06: qty 1

## 2014-09-06 MED ORDER — SUCCINYLCHOLINE CHLORIDE 20 MG/ML IJ SOLN
INTRAMUSCULAR | Status: DC | PRN
  Administered 2014-09-06: 60 mg via INTRAVENOUS

## 2014-09-06 MED ORDER — METOCLOPRAMIDE HCL 5 MG/ML IJ SOLN
INTRAMUSCULAR | Status: DC | PRN
  Administered 2014-09-06: 10 mg via INTRAVENOUS

## 2014-09-06 MED ORDER — LACTATED RINGERS IV SOLN: INTRAVENOUS | Status: DC

## 2014-09-06 MED ORDER — DEXAMETHASONE SODIUM PHOSPHATE 10 MG/ML IJ SOLN
INTRAMUSCULAR | Status: AC
  Filled 2014-09-06: qty 1

## 2014-09-06 MED ORDER — FENTANYL CITRATE (PF) 100 MCG/2ML IJ SOLN
INTRAMUSCULAR | Status: DC | PRN
  Administered 2014-09-06: 50 ug via INTRAVENOUS
  Administered 2014-09-06: 25 ug via INTRAVENOUS

## 2014-09-06 MED ORDER — PHENAZOPYRIDINE HCL 200 MG PO TABS
Freq: Once | ORAL | Status: AC
  Administered 2014-09-06: 09:00:00 via INTRAVESICAL
  Filled 2014-09-06: qty 15

## 2014-09-06 MED ORDER — DEXAMETHASONE SODIUM PHOSPHATE 10 MG/ML IJ SOLN
INTRAMUSCULAR | Status: DC | PRN
  Administered 2014-09-06: 10 mg via INTRAVENOUS

## 2014-09-06 MED ORDER — LACTATED RINGERS IV SOLN
INTRAVENOUS | Status: DC
  Administered 2014-09-06: 1000 mL via INTRAVENOUS

## 2014-09-06 MED ORDER — PHENAZOPYRIDINE HCL 200 MG PO TABS: 200.0000 mg | ORAL_TABLET | Freq: Three times a day (TID) | ORAL | Status: DC | PRN

## 2014-09-06 MED ORDER — CIPROFLOXACIN IN D5W 400 MG/200ML IV SOLN
400.0000 mg | INTRAVENOUS | Status: AC
  Administered 2014-09-06: 400 mg via INTRAVENOUS

## 2014-09-06 MED ORDER — CIPROFLOXACIN IN D5W 400 MG/200ML IV SOLN
INTRAVENOUS | Status: AC
  Filled 2014-09-06: qty 200

## 2014-09-06 MED ORDER — LIDOCAINE HCL (CARDIAC) 20 MG/ML IV SOLN
INTRAVENOUS | Status: DC | PRN
  Administered 2014-09-06: 40 mg via INTRAVENOUS

## 2014-09-06 MED ORDER — SODIUM CHLORIDE 0.9 % IR SOLN
Status: DC | PRN
  Administered 2014-09-06: 3000 mL via INTRAVESICAL

## 2014-09-06 MED ORDER — ONDANSETRON HCL 4 MG/2ML IJ SOLN
INTRAMUSCULAR | Status: DC | PRN
  Administered 2014-09-06: 4 mg via INTRAVENOUS

## 2014-09-06 MED ORDER — ONDANSETRON HCL 4 MG/2ML IJ SOLN
INTRAMUSCULAR | Status: AC
  Filled 2014-09-06: qty 2

## 2014-09-06 MED ORDER — CIPROFLOXACIN HCL 250 MG PO TABS: 250.0000 mg | ORAL_TABLET | Freq: Two times a day (BID) | ORAL | Status: AC

## 2014-09-06 MED ORDER — MIDAZOLAM HCL 2 MG/2ML IJ SOLN
INTRAMUSCULAR | Status: AC
  Filled 2014-09-06: qty 2

## 2014-09-06 SURGICAL SUPPLY — 17 items
BAG URO CATCHER STRL LF (DRAPE) ×2 IMPLANT
CATH FOLEY 2WAY 5CC 20FR (CATHETERS) ×2 IMPLANT
CATH ROBINSON RED A/P 16FR (CATHETERS) ×2 IMPLANT
CATH ROBINSON RED A/P 18FR (CATHETERS) IMPLANT
CATH URET 5FR 28IN OPEN ENDED (CATHETERS) ×1 IMPLANT
CYSTOGRAFIN 30% 250ML (MISCELLANEOUS) IMPLANT
GLOVE SURG SS PI 8.0 STRL IVOR (GLOVE) ×1 IMPLANT
GOWN STRL REUS W/TWL XL LVL3 (GOWN DISPOSABLE) ×2 IMPLANT
MANIFOLD NEPTUNE II (INSTRUMENTS) ×2 IMPLANT
NDL SAFETY ECLIPSE 18X1.5 (NEEDLE) IMPLANT
NEEDLE HYPO 18GX1.5 SHARP (NEEDLE)
NEEDLE HYPO 22GX1.5 SAFETY (NEEDLE) IMPLANT
PACK CYSTO (CUSTOM PROCEDURE TRAY) ×2 IMPLANT
PLUG CATH AND CAP STER (CATHETERS) ×2 IMPLANT
SYR BULB IRRIGATION 50ML (SYRINGE) IMPLANT
TUBING CONNECTING 10 (TUBING) ×1 IMPLANT
WATER STERILE IRR 1500ML POUR (IV SOLUTION) IMPLANT

## 2014-09-06 NOTE — Transfer of Care (Signed)
Immediate Anesthesia Transfer of Care Note  Patient: Kara Bradley  Procedure(s) Performed: Procedure(s): CYSTOSCOPY/HYDRODISTENSION MARCAINE PYRIDIUM,BILATERAL RETROGRADE (Bilateral)  Patient Location: PACU  Anesthesia Type:General  Level of Consciousness: awake, alert  and oriented  Airway & Oxygen Therapy: Patient Spontanous Breathing and Patient connected to face mask oxygen  Post-op Assessment: Report given to RN and Post -op Vital signs reviewed and stable  Post vital signs: Reviewed and stable  Last Vitals:  Filed Vitals:   09/06/14 0633  BP: 112/69  Pulse: 67  Temp: 36.7 C  Resp: 18    Complications: No apparent anesthesia complications

## 2014-09-06 NOTE — Anesthesia Procedure Notes (Signed)
Procedure Name: Intubation Date/Time: 09/06/2014 9:01 AM Performed by: Glory Buff Pre-anesthesia Checklist: Patient identified, Emergency Drugs available, Suction available and Patient being monitored Patient Re-evaluated:Patient Re-evaluated prior to inductionOxygen Delivery Method: Circle System Utilized Preoxygenation: Pre-oxygenation with 100% oxygen Intubation Type: IV induction Ventilation: Mask ventilation without difficulty Laryngoscope Size: Miller and 3 Grade View: Grade I Tube type: Oral Tube size: 7.0 mm Number of attempts: 1 Airway Equipment and Method: Stylet and Oral airway Placement Confirmation: ETT inserted through vocal cords under direct vision,  positive ETCO2 and breath sounds checked- equal and bilateral Secured at: 18 cm Tube secured with: Tape Dental Injury: Teeth and Oropharynx as per pre-operative assessment

## 2014-09-06 NOTE — Anesthesia Preprocedure Evaluation (Addendum)
Anesthesia Evaluation  Patient identified by MRN, date of birth, ID band Patient awake    Reviewed: Allergy & Precautions, NPO status , Patient's Chart, lab work & pertinent test results  History of Anesthesia Complications (+) PONV  Airway Mallampati: II  TM Distance: <3 FB Neck ROM: Full    Dental no notable dental hx.    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   Abdominal   Peds negative pediatric ROS (+)  Hematology negative hematology ROS (+)   Anesthesia Other Findings   Reproductive/Obstetrics negative OB ROS                            Anesthesia Physical Anesthesia Plan  ASA: II  Anesthesia Plan: General   Post-op Pain Management:    Induction: Intravenous  Airway Management Planned: Oral ETT  Additional Equipment:   Intra-op Plan:   Post-operative Plan: Extubation in OR  Informed Consent: I have reviewed the patients History and Physical, chart, labs and discussed the procedure including the risks, benefits and alternatives for the proposed anesthesia with the patient or authorized representative who has indicated his/her understanding and acceptance.   Dental advisory given  Plan Discussed with: CRNA  Anesthesia Plan Comments:         Anesthesia Quick Evaluation

## 2014-09-06 NOTE — Discharge Instructions (Addendum)
CYSTOSCOPY HOME CARE INSTRUCTIONS  Activity: Rest for the remainder of the day.  Do not drive or operate equipment today.  You may resume normal activities in one to two days as instructed by your physician.   Meals: Drink plenty of liquids and eat light foods such as gelatin or soup this evening.  You may return to a normal meal plan tomorrow.  Return to Work: You may return to work in one to two days or as instructed by your physician.  Special Instructions / Symptoms: Call your physician if any of these symptoms occur:   -persistent or heavy bleeding  -bleeding which continues after first few urination  -large blood clots that are difficult to pass  -urine stream diminishes or stops completely  -fever equal to or higher than 101 degrees Farenheit.  -cloudy urine with a strong, foul odor  -severe pain  Females should always wipe from front to back after elimination.  You may feel some burning pain when you urinate.  This should disappear with time.  Applying moist heat to the lower abdomen or a hot tub bath may help relieve the pain. \    Patient Signature:  ________________________________________________________  Nurse's Signature:  ________________________________________________________ General Anesthesia, Care After Refer to this sheet in the next few weeks. These instructions provide you with information on caring for yourself after your procedure. Your health care provider may also give you more specific instructions. Your treatment has been planned according to current medical practices, but problems sometimes occur. Call your health care provider if you have any problems or questions after your procedure. WHAT TO EXPECT AFTER THE PROCEDURE After the procedure, it is typical to experience:  Sleepiness.  Nausea and vomiting. HOME CARE INSTRUCTIONS  For the first 24 hours after general anesthesia:  Have a responsible person with you.  Do not drive a car. If you are  alone, do not take public transportation.  Do not drink alcohol.  Do not take medicine that has not been prescribed by your health care provider.  Do not sign important papers or make important decisions.  You may resume a normal diet and activities as directed by your health care provider.  Change bandages (dressings) as directed.  If you have questions or problems that seem related to general anesthesia, call the hospital and ask for the anesthetist or anesthesiologist on call. SEEK MEDICAL CARE IF:  You have nausea and vomiting that continue the day after anesthesia.  You develop a rash. SEEK IMMEDIATE MEDICAL CARE IF:   You have difficulty breathing.  You have chest pain.  You have any allergic problems. Document Released: 05/06/2000 Document Revised: 02/02/2013 Document Reviewed: 08/13/2012 [facility] Patient Information 2015 [place], [place]. This information is not intended to replace advice given to you by your health care provider. Make sure you discuss any questions you have with your health care provider.

## 2014-09-06 NOTE — Anesthesia Postprocedure Evaluation (Signed)
  Anesthesia Post-op Note  Patient: Kara Bradley  Procedure(s) Performed: Procedure(s) (LRB): CYSTOSCOPY/HYDRODISTENSION MARCAINE PYRIDIUM,BILATERAL RETROGRADE (Bilateral)  Patient Location: PACU  Anesthesia Type: General  Level of Consciousness: awake and alert   Airway and Oxygen Therapy: Patient Spontanous Breathing  Post-op Pain: mild  Post-op Assessment: Post-op Vital signs reviewed, Patient's Cardiovascular Status Stable, Respiratory Function Stable, Patent Airway and No signs of Nausea or vomiting  Last Vitals:  Filed Vitals:   09/06/14 1130  BP: 113/76  Pulse: 78  Temp: 36.7 C  Resp: 18    Post-op Vital Signs: stable   Complications: No apparent anesthesia complications

## 2014-09-06 NOTE — Brief Op Note (Signed)
09/06/2014  9:27 AM  PATIENT:  Kara Bradley  49 y.o. female  PRE-OPERATIVE DIAGNOSIS:  MICROHEMATURIA PAINFUL BLADDER  POST-OPERATIVE DIAGNOSIS:  INTERSTITIAL CYSTITIS  PROCEDURE:  Procedure(s): CYSTOSCOPY/HYDRODISTENSION MARCAINE PYRIDIUM,BILATERAL RETROGRADE (Bilateral)  SURGEON:  Surgeon(s) and Role:    * Irine Seal, MD - Primary  PHYSICIAN ASSISTANT:   ASSISTANTS: none   ANESTHESIA:   general  EBL:     BLOOD ADMINISTERED:none  DRAINS: none   LOCAL MEDICATIONS USED:  Pyridium and Marcaine  SPECIMEN:  No Specimen  DISPOSITION OF SPECIMEN:  N/A  COUNTS:  YES  TOURNIQUET:  * No tourniquets in log *  DICTATION: .Other Dictation: Dictation Number [id number]  PLAN OF CARE: Discharge to home after PACU  PATIENT DISPOSITION:  PACU - hemodynamically stable.   Delay start of Pharmacological VTE agent (>24hrs) due to surgical blood loss or risk of bleeding: not applicable

## 2014-09-06 NOTE — Interval H&P Note (Signed)
History and Physical Interval Note:  09/06/2014 8:49 AM  Kara Bradley  has presented today for surgery, with the diagnosis of MICROHEMATURIA PAINFUL BLADDER  The various methods of treatment have been discussed with the patient and family. After consideration of risks, benefits and other options for treatment, the patient has consented to  Procedure(s): CYSTOSCOPY/HYDRODISTENSION MARCAINE PYRIDIUM,BILATERAL RETROGRADE (Bilateral) as a surgical intervention .  The patient's history has been reviewed, patient examined, no change in status, stable for surgery.  I have reviewed the patient's chart and labs.  Questions were answered to the patient's satisfaction.     Wessley Emert J

## 2014-09-07 ENCOUNTER — Encounter (HOSPITAL_COMMUNITY): Payer: Self-pay | Admitting: Urology

## 2014-09-07 NOTE — Op Note (Signed)
NAMEMarland Kitchen  MARKEYA, MINCY NO.:  [id number]  MEDICAL RECORD NO.:  [id number]  LOCATION:  [facility]                         FACILITY:  [facility]  PHYSICIAN:  Marshall Cork. Jeffie Pollock, M.D.    DATE OF BIRTH:  [birth year]  DATE OF PROCEDURE:  09/06/2014 DATE OF DISCHARGE:  09/06/2014                              OPERATIVE REPORT   PROCEDURES: 1. Cystoscopy with bilateral retrograde pyelograms and interpretation. 2. Hydrodistention of bladder with instillation of Pyridium and     Marcaine.  PREOPERATIVE DIAGNOSIS:  Painful bladder and micro hematuria.  POSTOPERATIVE DIAGNOSIS:  Painful bladder and micro hematuria with interstitial cystitis.  SURGEON:  Marshall Cork. Jeffie Pollock, M.D.  ANESTHESIA:  General.  ESTIMATED BLOOD LOSS:  None.  DRAINS:  None.  COMPLICATIONS:  None.  INDICATIONS:  Ms. Millar is a 49 year old white female who presented with painful bladder symptoms and micro hematuria.  After reviewing the options, she elected to undergo retrograde pyelography and hydrodistention of bladder to rule out interstitial cystitis.  She had undergone a renal ultrasound which showed no other renal issues.  FINDINGS OF PROCEDURE:  She was given Cipro.  She was taken to the operating room where general anesthetic was induced.  She was placed in lithotomy position.  Her perineum and genitalia were prepped with Betadine solution.  She was draped in usual sterile fashion.  Cystoscopy was performed using a 23-French scope and the 30-degree lens. Examination revealed normal urethra.  The bladder wall was smooth and pale without tumor, stones, or inflammation.  Ureteral orifices were unremarkable, effluxing clear urine.  Retrograde pyelography was performed using a 5-French open-end catheter and Omnipaque.  Right retrograde pyelogram revealed normal ureter and intrarenal collecting system.  Left retrograde pyelogram revealed normal ureter and intrarenal collecting system.  After completion  of retrograde pyelogram, the fluid was elevated to provide 80 cm water pressure and the bladder was filled to capacity. Once capacity was reached, the bladder was drained.  Her capacity under anesthesia was approximately 800 mL with a bloody terminal efflux.  Repeat cystoscopy after hydrodistention demonstrated diffuse glomerulations, but no other bladder wall injury or ulcers.  These findings were consistent with interstitial cystitis.  At this point, the bladder was drained and the cystoscope was removed. A red rubber catheter was placed and the bladder was instilled with 400 mg of crushed Pyridium in 30 mL of 0.25% Marcaine.  The catheter was then removed.  The patient was taken down from lithotomy position.  Her anesthetic was reversed.  She was moved to the recovery room in stable condition.  There were no complications.     Marshall Cork. Jeffie Pollock, M.D.     JJW/MEDQ  D:  09/06/2014  T:  09/07/2014  Job:  [id number]

## 2014-10-08 ENCOUNTER — Other Ambulatory Visit (HOSPITAL_COMMUNITY)
Admission: RE | Admit: 2014-10-08 | Discharge: 2014-10-08 | Disposition: A | Discharge status: Home / Self Care | Payer: 59 | Source: Ambulatory Visit | Attending: Pulmonary Disease | Admitting: Pulmonary Disease

## 2014-10-08 DIAGNOSIS — Z0000 Encounter for general adult medical examination without abnormal findings: Secondary | ICD-10-CM | POA: Diagnosis present

## 2014-10-08 LAB — COMPREHENSIVE METABOLIC PANEL
ALT: 30 U/L (ref 14–54)
AST: 25 U/L (ref 15–41)
Albumin: 4.6 g/dL (ref 3.5–5.0)
Alkaline Phosphatase: 38 U/L (ref 38–126)
Anion gap: 7 (ref 5–15)
BILIRUBIN TOTAL: 0.6 mg/dL (ref 0.3–1.2)
BUN: 16 mg/dL (ref 6–20)
CO2: 29 mmol/L (ref 22–32)
CREATININE: 0.81 mg/dL (ref 0.44–1.00)
Calcium: 9.3 mg/dL (ref 8.9–10.3)
Chloride: 105 mmol/L (ref 101–111)
GFR calc Af Amer: >60 mL/min (ref >60)
GFR calc non Af Amer: >60 mL/min (ref >60)
Glucose, Bld: 85 mg/dL (ref 65–99)
Potassium: 4 mmol/L (ref 3.5–5.1)
Sodium: 141 mmol/L (ref 135–145)
TOTAL PROTEIN: 7.8 g/dL (ref 6.5–8.1)

## 2014-10-08 LAB — CBC
HEMATOCRIT: 40.5 % (ref 36.0–46.0)
HEMOGLOBIN: 13.7 g/dL (ref 12.0–15.0)
MCH: 32.3 pg (ref 26.0–34.0)
MCHC: 33.8 g/dL (ref 30.0–36.0)
MCV: 95.5 fL (ref 78.0–100.0)
Platelets: 182 10*3/uL (ref 150–400)
RBC: 4.24 MIL/uL (ref 3.87–5.11)
RDW: 11.9 % (ref 11.5–15.5)
WBC: 5 10*3/uL (ref 4.0–10.5)

## 2014-10-08 LAB — LIPID PANEL
CHOLESTEROL: 170 mg/dL (ref 0–200)
HDL: 86 mg/dL (ref >40)
LDL Cholesterol: 66 mg/dL (ref 0–99)
Total CHOL/HDL Ratio: 2 RATIO
Triglycerides: 88 mg/dL (ref <150)
VLDL: 18 mg/dL (ref 0–40)

## 2014-10-08 LAB — TSH: TSH: 3.123 u[IU]/mL (ref 0.350–4.500)

## 2014-10-10 LAB — VITAMIN D 25 HYDROXY (VIT D DEFICIENCY, FRACTURES): VIT D 25 HYDROXY: 33.6 ng/mL (ref 30.0–100.0)

## 2015-03-20 MED FILL — FLUCONAZOLE 150 MG TABLET: 150 | 3 days supply | Qty: 3 | Fill #0

## 2015-04-05 DIAGNOSIS — R3989 Other symptoms and signs involving the genitourinary system: Secondary | ICD-10-CM | POA: Diagnosis not present

## 2015-04-05 DIAGNOSIS — N3010 Interstitial cystitis (chronic) without hematuria: Secondary | ICD-10-CM | POA: Diagnosis not present

## 2015-04-05 DIAGNOSIS — Z0000 Encounter for general adult medical examination without abnormal findings: Secondary | ICD-10-CM | POA: Diagnosis not present

## 2015-04-05 MED FILL — ELMIRON 100 MG CAPSULE: 100 | 30 days supply | Qty: 90 | Fill #0

## 2015-04-06 DIAGNOSIS — R895 Abnormal microbiological findings in specimens from other organs, systems and tissues: Secondary | ICD-10-CM | POA: Diagnosis not present

## 2015-04-06 DIAGNOSIS — M329 Systemic lupus erythematosus, unspecified: Secondary | ICD-10-CM | POA: Diagnosis not present

## 2015-04-11 DIAGNOSIS — Z79899 Other long term (current) drug therapy: Secondary | ICD-10-CM | POA: Diagnosis not present

## 2015-04-11 DIAGNOSIS — M3210 Systemic lupus erythematosus, organ or system involvement unspecified: Secondary | ICD-10-CM | POA: Diagnosis not present

## 2015-05-09 MED FILL — predniSONE 5 MG TABS: 5 | 90 days supply | Qty: 90 | Fill #1

## 2015-05-09 MED FILL — HYDROXYCHLOROQUINE 200 MG T: 200 | 90 days supply | Qty: 90 | Fill #1

## 2015-07-26 DIAGNOSIS — N3010 Interstitial cystitis (chronic) without hematuria: Secondary | ICD-10-CM | POA: Diagnosis not present

## 2015-07-28 DIAGNOSIS — M810 Age-related osteoporosis without current pathological fracture: Secondary | ICD-10-CM | POA: Diagnosis not present

## 2015-07-28 DIAGNOSIS — Z79899 Other long term (current) drug therapy: Secondary | ICD-10-CM | POA: Diagnosis not present

## 2015-07-28 DIAGNOSIS — L932 Other local lupus erythematosus: Secondary | ICD-10-CM | POA: Diagnosis not present

## 2015-08-01 MED FILL — HYDROXYCHLOROQUINE 200 MG T: 200 | 90 days supply | Qty: 90 | Fill #0

## 2015-08-01 MED FILL — predniSONE 5 MG TABS: 5 | 90 days supply | Qty: 90 | Fill #0

## 2015-08-23 MED FILL — AZITHROMYCIN 500 MG TABLET: 500 | 3 days supply | Qty: 3 | Fill #0

## 2015-09-23 IMAGING — RF DG RETROGRADE PYELOGRAM
1 series · 6 of 6 positions shown · non-contrast
Comparison: None.

CLINICAL DATA: Chronic kidney disease

EXAM:
INTRAOPERATIVE BILATERAL RETROGRADE UROGRAPHY
TECHNIQUE: Images were obtained with the C-arm fluoroscopic device
intraoperatively and submitted for interpretation post-operatively.
Please see the procedural report for the amount of contrast and the
fluoroscopy time utilized.

[Series 1: run · 6 of 6 slices shown]
[im 1/6]
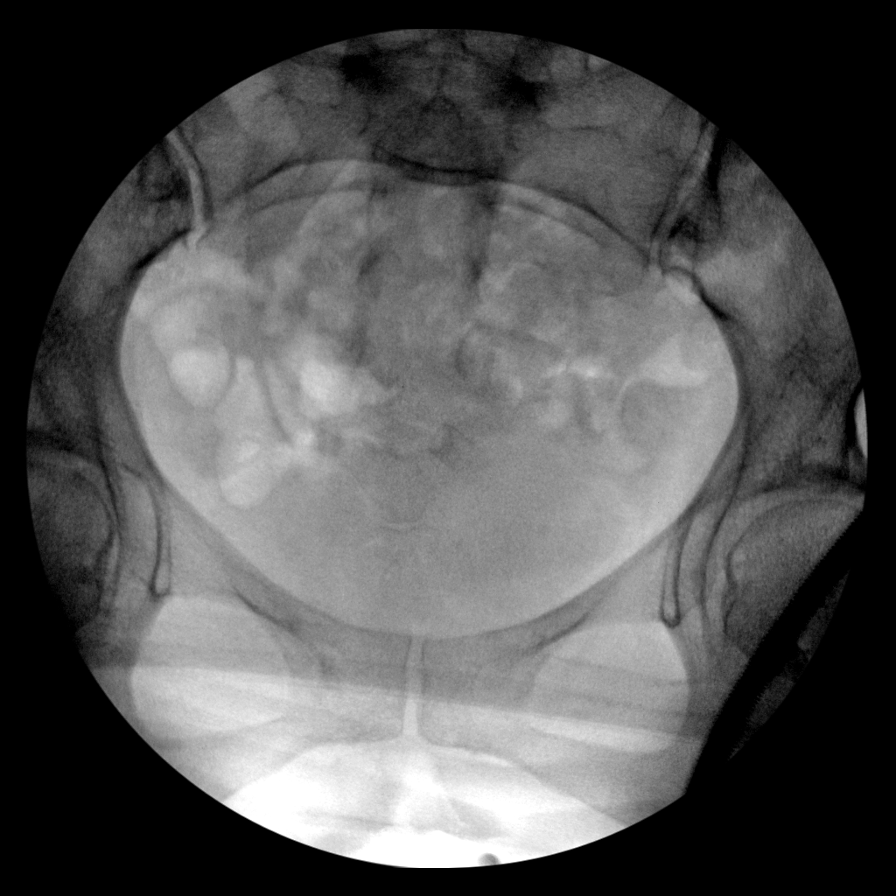
[im 2/6]
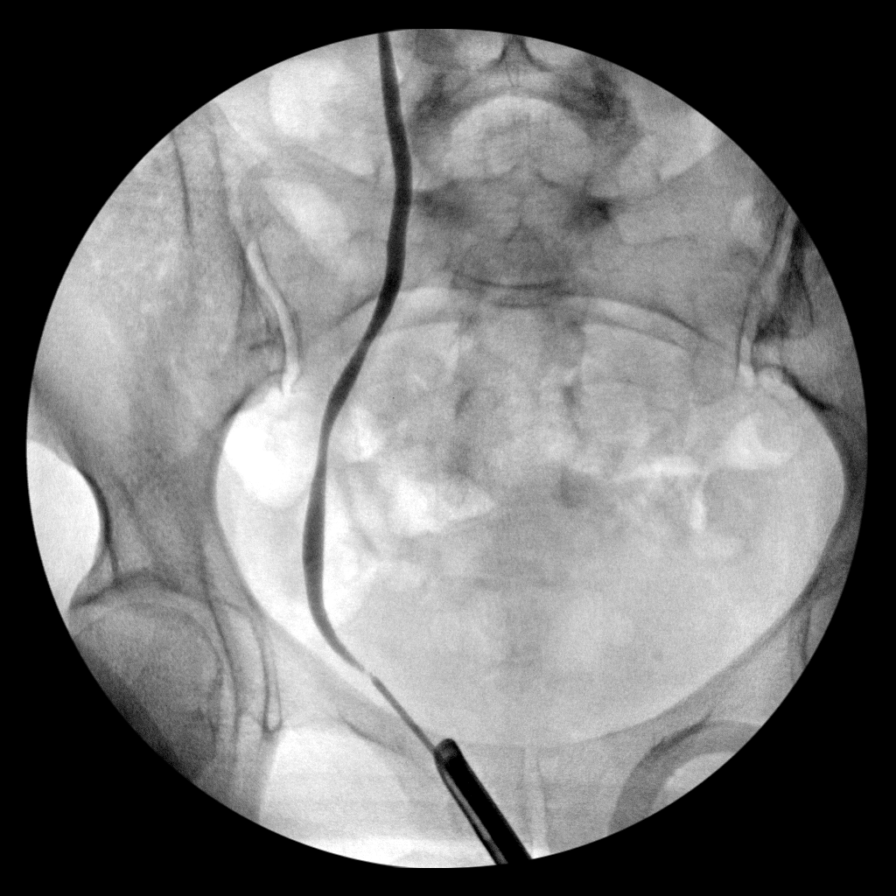
[im 3/6]
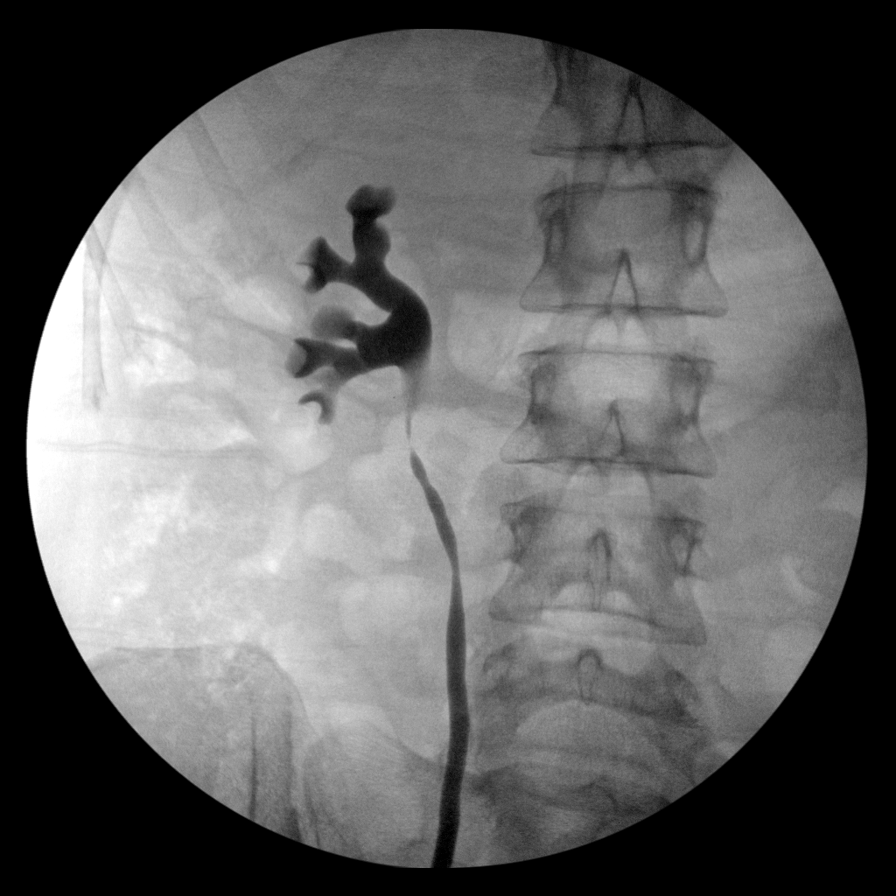
[im 4/6]
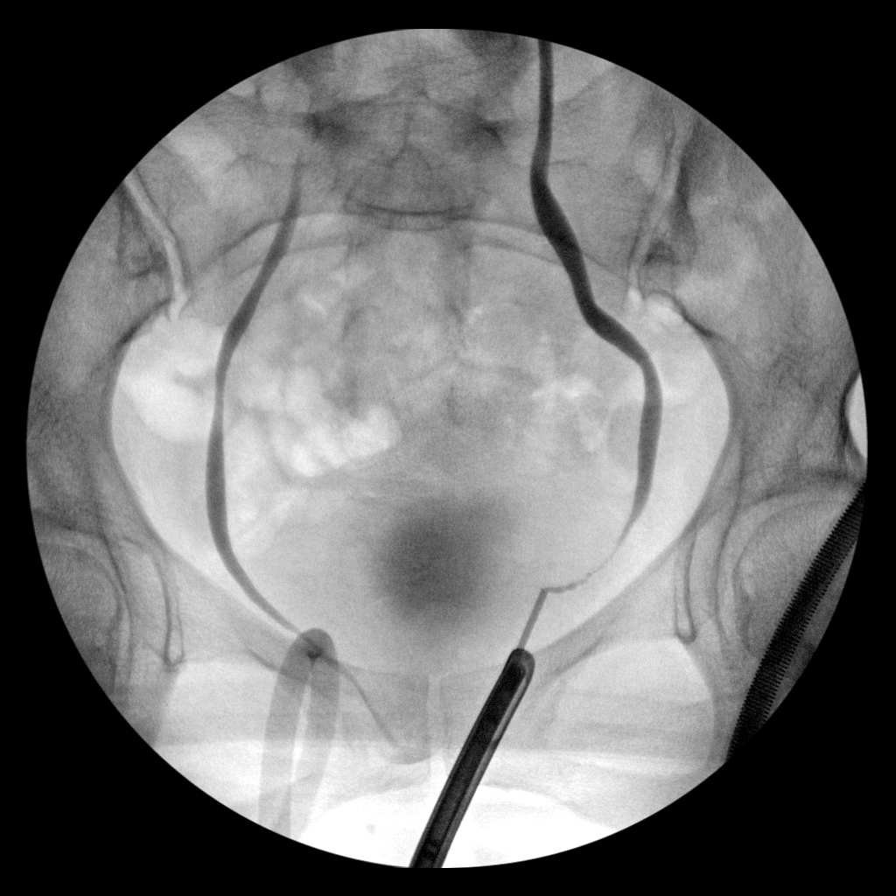
[im 5/6]
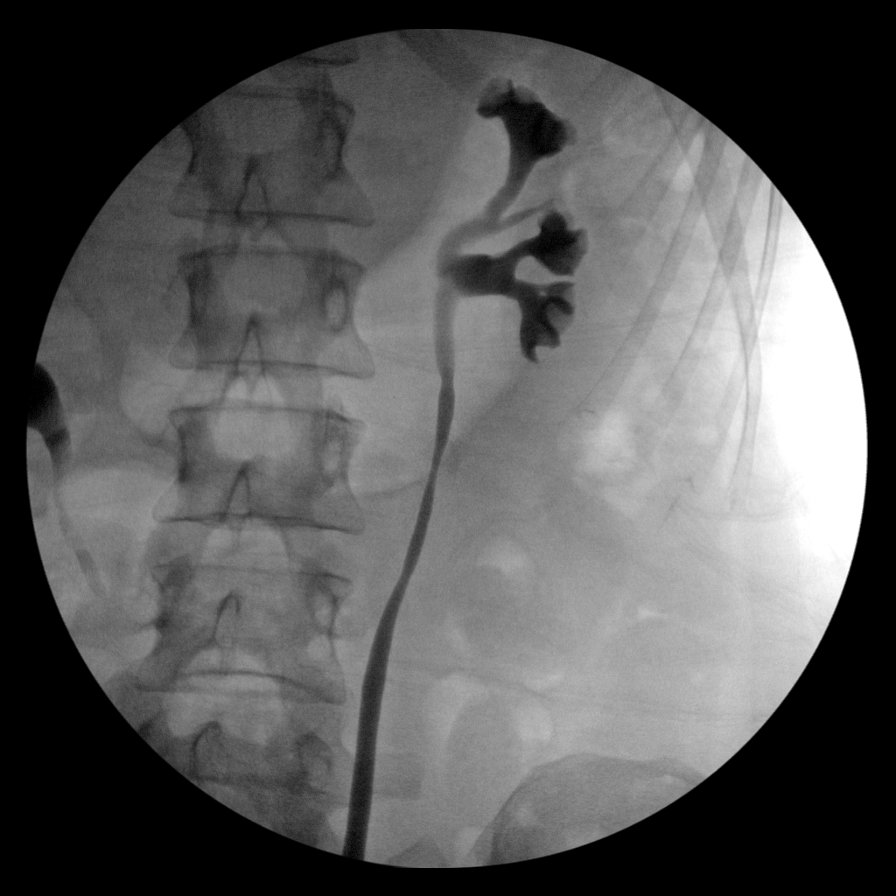
[im 6/6]
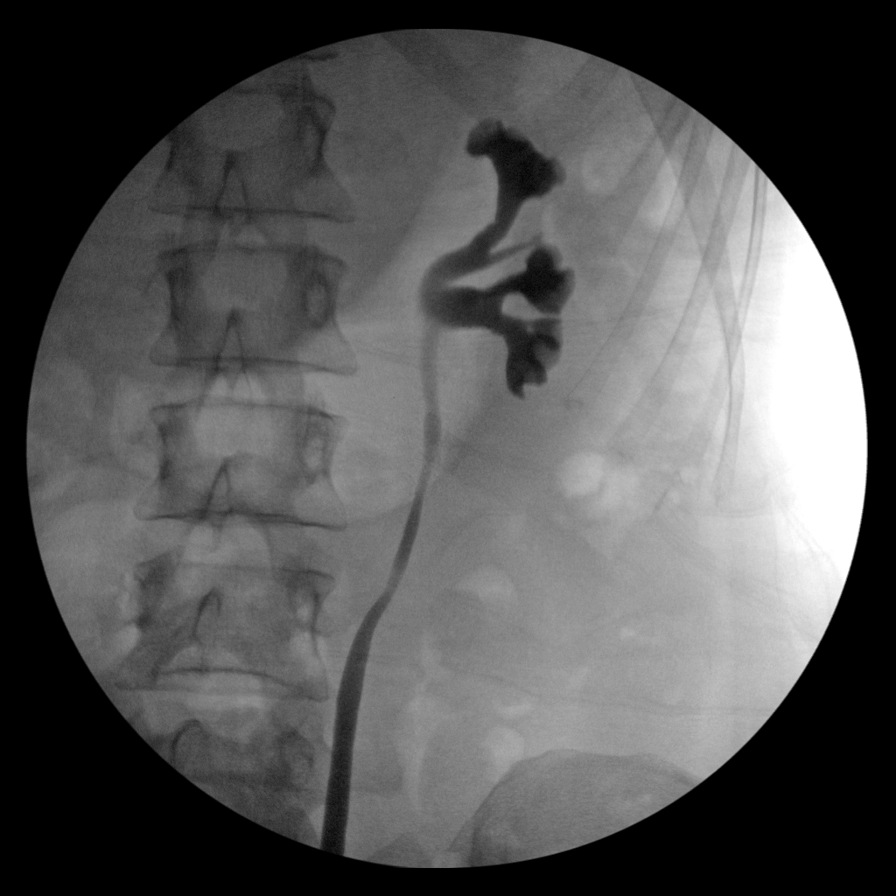

[6 of 6 positions shown; findings below may reference images not displayed]

FINDINGS: Contrast fills the bilateral ureters and renal collecting systems.
No filling defect. No obstruction.
IMPRESSION: See above.

## 2015-10-04 DIAGNOSIS — Z0000 Encounter for general adult medical examination without abnormal findings: Secondary | ICD-10-CM | POA: Diagnosis not present

## 2015-10-07 ENCOUNTER — Other Ambulatory Visit (HOSPITAL_COMMUNITY)
Admission: RE | Admit: 2015-10-07 | Discharge: 2015-10-07 | Disposition: A | Discharge status: Home / Self Care | Payer: 59 | Source: Other Acute Inpatient Hospital | Attending: Radiology | Admitting: Radiology

## 2015-10-07 DIAGNOSIS — Z0000 Encounter for general adult medical examination without abnormal findings: Secondary | ICD-10-CM | POA: Diagnosis not present

## 2015-10-07 LAB — CBC
HCT: 40.9 % (ref 36.0–46.0)
Hemoglobin: 13.6 g/dL (ref 12.0–15.0)
MCH: 31.4 pg (ref 26.0–34.0)
MCHC: 33.3 g/dL (ref 30.0–36.0)
MCV: 94.5 fL (ref 78.0–100.0)
PLATELETS: 231 10*3/uL (ref 150–400)
RBC: 4.33 MIL/uL (ref 3.87–5.11)
RDW: 12 % (ref 11.5–15.5)
WBC: 5.4 10*3/uL (ref 4.0–10.5)

## 2015-10-07 LAB — COMPREHENSIVE METABOLIC PANEL
ALT: 32 U/L (ref 14–54)
ANION GAP: 4 — AB (ref 5–15)
AST: 25 U/L (ref 15–41)
Albumin: 4.4 g/dL (ref 3.5–5.0)
Alkaline Phosphatase: 51 U/L (ref 38–126)
BUN: 15 mg/dL (ref 6–20)
CHLORIDE: 105 mmol/L (ref 101–111)
CO2: 27 mmol/L (ref 22–32)
CREATININE: 0.68 mg/dL (ref 0.44–1.00)
Calcium: 9 mg/dL (ref 8.9–10.3)
Glucose, Bld: 85 mg/dL (ref 65–99)
POTASSIUM: 4.2 mmol/L (ref 3.5–5.1)
Sodium: 136 mmol/L (ref 135–145)
Total Bilirubin: 0.6 mg/dL (ref 0.3–1.2)
Total Protein: 7.6 g/dL (ref 6.5–8.1)

## 2015-10-07 LAB — LIPID PANEL
CHOLESTEROL: 177 mg/dL (ref 0–200)
HDL: 90 mg/dL (ref >40)
LDL Cholesterol: 69 mg/dL (ref 0–99)
TRIGLYCERIDES: 88 mg/dL (ref <150)
Total CHOL/HDL Ratio: 2 RATIO
VLDL: 18 mg/dL (ref 0–40)

## 2015-10-07 LAB — TSH: TSH: 3.863 u[IU]/mL (ref 0.350–4.500)

## 2015-10-08 LAB — FOLLICLE STIMULATING HORMONE: FSH: 86.7 m[IU]/mL

## 2015-10-09 LAB — VITAMIN D 25 HYDROXY (VIT D DEFICIENCY, FRACTURES): Vit D, 25-Hydroxy: 32.8 ng/mL (ref 30.0–100.0)

## 2015-10-10 DIAGNOSIS — H5213 Myopia, bilateral: Secondary | ICD-10-CM | POA: Diagnosis not present

## 2015-10-10 DIAGNOSIS — M3210 Systemic lupus erythematosus, organ or system involvement unspecified: Secondary | ICD-10-CM | POA: Diagnosis not present

## 2015-10-10 DIAGNOSIS — Z79899 Other long term (current) drug therapy: Secondary | ICD-10-CM | POA: Diagnosis not present

## 2015-10-10 DIAGNOSIS — H524 Presbyopia: Secondary | ICD-10-CM | POA: Diagnosis not present

## 2015-10-19 ENCOUNTER — Ambulatory Visit (INDEPENDENT_AMBULATORY_CARE_PROVIDER_SITE_OTHER): Payer: 59 | Admitting: Obstetrics and Gynecology

## 2015-10-19 ENCOUNTER — Encounter: Payer: Self-pay | Admitting: Obstetrics and Gynecology

## 2015-10-19 VITALS — BP 92/62 | HR 67 | Ht 62.25 in | Wt 104.4 lb

## 2015-10-19 DIAGNOSIS — Z0000 Encounter for general adult medical examination without abnormal findings: Secondary | ICD-10-CM

## 2015-10-19 DIAGNOSIS — Z01419 Encounter for gynecological examination (general) (routine) without abnormal findings: Secondary | ICD-10-CM | POA: Diagnosis not present

## 2015-10-19 DIAGNOSIS — Z780 Asymptomatic menopausal state: Secondary | ICD-10-CM | POA: Insufficient documentation

## 2015-10-19 DIAGNOSIS — Z8739 Personal history of other diseases of the musculoskeletal system and connective tissue: Secondary | ICD-10-CM
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
## 2015-10-27 DIAGNOSIS — Z79899 Other long term (current) drug therapy: Secondary | ICD-10-CM | POA: Diagnosis not present

## 2015-10-27 DIAGNOSIS — L932 Other local lupus erythematosus: Secondary | ICD-10-CM | POA: Diagnosis not present

## 2015-10-27 DIAGNOSIS — M810 Age-related osteoporosis without current pathological fracture: Secondary | ICD-10-CM | POA: Diagnosis not present

## 2015-10-30 MED FILL — HYDROXYCHLOROQUINE 200 MG T: 200 | 90 days supply | Qty: 135 | Fill #0

## 2015-11-15 ENCOUNTER — Other Ambulatory Visit: Payer: Self-pay | Admitting: Obstetrics and Gynecology

## 2015-11-15 DIAGNOSIS — Z1231 Encounter for screening mammogram for malignant neoplasm of breast: Secondary | ICD-10-CM

## 2015-11-23 ENCOUNTER — Ambulatory Visit (HOSPITAL_COMMUNITY)
Admission: RE | Admit: 2015-11-23 | Discharge: 2015-11-23 | Disposition: A | Discharge status: Home / Self Care | Payer: 59 | Source: Ambulatory Visit | Attending: Obstetrics and Gynecology | Admitting: Obstetrics and Gynecology

## 2015-11-23 DIAGNOSIS — Z1231 Encounter for screening mammogram for malignant neoplasm of breast: Secondary | ICD-10-CM | POA: Insufficient documentation

## 2015-12-14 DIAGNOSIS — L930 Discoid lupus erythematosus: Secondary | ICD-10-CM | POA: Diagnosis not present

## 2015-12-15 MED FILL — FLUTICASONE PROP 0.05% CRM: 0.05 | 15 days supply | Qty: 60 | Fill #0

## 2015-12-15 MED FILL — CLOBETASOL 0.05% CREAM: 0.05 | 20 days supply | Qty: 60 | Fill #0

## 2016-01-26 DIAGNOSIS — L932 Other local lupus erythematosus: Secondary | ICD-10-CM | POA: Diagnosis not present

## 2016-01-26 DIAGNOSIS — Z79899 Other long term (current) drug therapy: Secondary | ICD-10-CM | POA: Diagnosis not present

## 2016-01-26 DIAGNOSIS — M810 Age-related osteoporosis without current pathological fracture: Secondary | ICD-10-CM | POA: Diagnosis not present

## 2016-01-26 MED FILL — HYDROXYCHLOROQUINE 200 MG T: 200 | 90 days supply | Qty: 135 | Fill #1

## 2016-01-30 DIAGNOSIS — D497 Neoplasm of unspecified behavior of endocrine glands and other parts of nervous system: Secondary | ICD-10-CM | POA: Diagnosis not present

## 2016-01-30 DIAGNOSIS — M47816 Spondylosis without myelopathy or radiculopathy, lumbar region: Secondary | ICD-10-CM | POA: Diagnosis not present

## 2016-04-16 DIAGNOSIS — Z79899 Other long term (current) drug therapy: Secondary | ICD-10-CM | POA: Diagnosis not present

## 2016-04-16 DIAGNOSIS — M3210 Systemic lupus erythematosus, organ or system involvement unspecified: Secondary | ICD-10-CM | POA: Diagnosis not present

## 2016-04-25 DIAGNOSIS — Z79899 Other long term (current) drug therapy: Secondary | ICD-10-CM | POA: Diagnosis not present

## 2016-04-25 DIAGNOSIS — M810 Age-related osteoporosis without current pathological fracture: Secondary | ICD-10-CM | POA: Diagnosis not present

## 2016-04-25 DIAGNOSIS — L932 Other local lupus erythematosus: Secondary | ICD-10-CM | POA: Diagnosis not present

## 2016-04-25 MED FILL — HYDROXYCHLOROQUINE 200 MG T: 200 | 90 days supply | Qty: 135 | Fill #0

## 2016-07-12 ENCOUNTER — Ambulatory Visit (INDEPENDENT_AMBULATORY_CARE_PROVIDER_SITE_OTHER): Payer: 59 | Admitting: Urology

## 2016-07-12 DIAGNOSIS — N393 Stress incontinence (female) (male): Secondary | ICD-10-CM

## 2016-07-12 DIAGNOSIS — N3010 Interstitial cystitis (chronic) without hematuria: Secondary | ICD-10-CM | POA: Diagnosis not present

## 2016-08-05 MED FILL — HYDROXYCHLOROQUINE 200 MG T: 200 | 30 days supply | Qty: 45 | Fill #1

## 2016-09-02 MED FILL — HYDROXYCHLOROQUINE 200 MG T: 200 | 60 days supply | Qty: 90 | Fill #2

## 2016-10-07 DIAGNOSIS — Z0000 Encounter for general adult medical examination without abnormal findings: Secondary | ICD-10-CM | POA: Diagnosis not present

## 2016-10-08 ENCOUNTER — Other Ambulatory Visit (HOSPITAL_COMMUNITY)
Admission: RE | Admit: 2016-10-08 | Discharge: 2016-10-08 | Disposition: A | Discharge status: Home / Self Care | Payer: 59 | Source: Ambulatory Visit | Attending: Pulmonary Disease | Admitting: Pulmonary Disease

## 2016-10-08 DIAGNOSIS — Z0000 Encounter for general adult medical examination without abnormal findings: Secondary | ICD-10-CM | POA: Diagnosis not present

## 2016-10-08 LAB — CBC WITH DIFFERENTIAL/PLATELET
BASOS ABS: 0 10*3/uL (ref 0.0–0.1)
Basophils Relative: 1 %
Eosinophils Absolute: 0 10*3/uL (ref 0.0–0.7)
Eosinophils Relative: 1 %
HCT: 39.5 % (ref 36.0–46.0)
Hemoglobin: 13.5 g/dL (ref 12.0–15.0)
LYMPHS PCT: 24 %
Lymphs Abs: 1.1 10*3/uL (ref 0.7–4.0)
MCH: 31.8 pg (ref 26.0–34.0)
MCHC: 34.2 g/dL (ref 30.0–36.0)
MCV: 92.9 fL (ref 78.0–100.0)
MONO ABS: 0.5 10*3/uL (ref 0.1–1.0)
Monocytes Relative: 11 %
NEUTROS ABS: 3 10*3/uL (ref 1.7–7.7)
NEUTROS PCT: 63 %
PLATELETS: 186 10*3/uL (ref 150–400)
RBC: 4.25 MIL/uL (ref 3.87–5.11)
RDW: 11.7 % (ref 11.5–15.5)
WBC: 4.7 10*3/uL (ref 4.0–10.5)

## 2016-10-08 LAB — COMPREHENSIVE METABOLIC PANEL
ALBUMIN: 4.8 g/dL (ref 3.5–5.0)
ALT: 29 U/L (ref 14–54)
ANION GAP: 11 (ref 5–15)
AST: 28 U/L (ref 15–41)
Alkaline Phosphatase: 54 U/L (ref 38–126)
BILIRUBIN TOTAL: 0.7 mg/dL (ref 0.3–1.2)
BUN: 16 mg/dL (ref 6–20)
CO2: 27 mmol/L (ref 22–32)
Calcium: 9.8 mg/dL (ref 8.9–10.3)
Chloride: 102 mmol/L (ref 101–111)
Creatinine, Ser: 0.81 mg/dL (ref 0.44–1.00)
GFR calc Af Amer: >60 mL/min (ref >60)
GFR calc non Af Amer: >60 mL/min (ref >60)
GLUCOSE: 88 mg/dL (ref 65–99)
POTASSIUM: 3.9 mmol/L (ref 3.5–5.1)
SODIUM: 140 mmol/L (ref 135–145)
TOTAL PROTEIN: 8.2 g/dL — AB (ref 6.5–8.1)

## 2016-10-08 LAB — LIPID PANEL
Cholesterol: 178 mg/dL (ref 0–200)
HDL: 89 mg/dL (ref >40)
LDL CALC: 79 mg/dL (ref 0–99)
TRIGLYCERIDES: 49 mg/dL (ref <150)
Total CHOL/HDL Ratio: 2 RATIO
VLDL: 10 mg/dL (ref 0–40)

## 2016-10-08 LAB — TSH: TSH: 2.027 u[IU]/mL (ref 0.350–4.500)

## 2016-10-09 LAB — VITAMIN D 25 HYDROXY (VIT D DEFICIENCY, FRACTURES): VIT D 25 HYDROXY: 34.8 ng/mL (ref 30.0–100.0)

## 2016-10-28 DIAGNOSIS — M810 Age-related osteoporosis without current pathological fracture: Secondary | ICD-10-CM | POA: Diagnosis not present

## 2016-10-28 DIAGNOSIS — Z79899 Other long term (current) drug therapy: Secondary | ICD-10-CM | POA: Diagnosis not present

## 2016-10-28 DIAGNOSIS — L932 Other local lupus erythematosus: Secondary | ICD-10-CM | POA: Diagnosis not present

## 2016-10-28 MED FILL — HYDROXYCHLOROQUINE 200 MG T: 200 | 60 days supply | Qty: 90 | Fill #0

## 2016-11-05 DIAGNOSIS — M3210 Systemic lupus erythematosus, organ or system involvement unspecified: Secondary | ICD-10-CM | POA: Diagnosis not present

## 2016-11-05 DIAGNOSIS — H52203 Unspecified astigmatism, bilateral: Secondary | ICD-10-CM | POA: Diagnosis not present

## 2016-11-05 DIAGNOSIS — Z79899 Other long term (current) drug therapy: Secondary | ICD-10-CM | POA: Diagnosis not present

## 2016-11-05 DIAGNOSIS — H524 Presbyopia: Secondary | ICD-10-CM | POA: Diagnosis not present

## 2016-11-05 DIAGNOSIS — H5213 Myopia, bilateral: Secondary | ICD-10-CM | POA: Diagnosis not present

## 2016-11-18 ENCOUNTER — Other Ambulatory Visit: Payer: Self-pay | Admitting: Obstetrics and Gynecology

## 2016-11-18 DIAGNOSIS — Z1231 Encounter for screening mammogram for malignant neoplasm of breast: Secondary | ICD-10-CM

## 2016-11-27 ENCOUNTER — Ambulatory Visit (HOSPITAL_COMMUNITY)
Admission: RE | Admit: 2016-11-27 | Discharge: 2016-11-27 | Disposition: A | Discharge status: Home / Self Care | Payer: 59 | Source: Ambulatory Visit | Attending: Obstetrics and Gynecology | Admitting: Obstetrics and Gynecology

## 2016-11-27 DIAGNOSIS — Z1231 Encounter for screening mammogram for malignant neoplasm of breast: Secondary | ICD-10-CM | POA: Diagnosis not present

## 2016-12-27 MED FILL — HYDROXYCHLOROQUINE 200 MG T: 200 | 30 days supply | Qty: 45 | Fill #1

## 2017-01-27 MED FILL — HYDROXYCHLOROQUINE 200 MG: 200 | 90 days supply | Qty: 135 | Fill #0

## 2017-01-28 DIAGNOSIS — D352 Benign neoplasm of pituitary gland: Secondary | ICD-10-CM | POA: Diagnosis not present

## 2017-04-28 DIAGNOSIS — M810 Age-related osteoporosis without current pathological fracture: Secondary | ICD-10-CM | POA: Diagnosis not present

## 2017-04-28 DIAGNOSIS — Z79899 Other long term (current) drug therapy: Secondary | ICD-10-CM | POA: Diagnosis not present

## 2017-04-28 DIAGNOSIS — D8989 Other specified disorders involving the immune mechanism, not elsewhere classified: Secondary | ICD-10-CM | POA: Diagnosis not present

## 2017-04-28 DIAGNOSIS — L932 Other local lupus erythematosus: Secondary | ICD-10-CM | POA: Diagnosis not present

## 2017-04-28 MED FILL — HYDROXYCHLOROQUINE 200 MG: 200 | 90 days supply | Qty: 135 | Fill #0

## 2017-06-10 MED FILL — MAGIC MW (WOODEN): 16 days supply | Qty: 240 | Fill #0

## 2017-07-28 MED FILL — HYDROXYCHLOROQUINE SULFATE: 200 | 90 days supply | Qty: 135 | Fill #1

## 2017-10-08 DIAGNOSIS — Z0000 Encounter for general adult medical examination without abnormal findings: Secondary | ICD-10-CM | POA: Diagnosis not present

## 2017-10-09 ENCOUNTER — Other Ambulatory Visit (HOSPITAL_COMMUNITY)
Admission: RE | Admit: 2017-10-09 | Discharge: 2017-10-09 | Disposition: A | Discharge status: Home / Self Care | Payer: 59 | Source: Ambulatory Visit | Attending: Pulmonary Disease | Admitting: Pulmonary Disease

## 2017-10-09 DIAGNOSIS — M329 Systemic lupus erythematosus, unspecified: Secondary | ICD-10-CM | POA: Insufficient documentation

## 2017-10-09 DIAGNOSIS — R895 Abnormal microbiological findings in specimens from other organs, systems and tissues: Secondary | ICD-10-CM | POA: Diagnosis not present

## 2017-10-09 DIAGNOSIS — N3010 Interstitial cystitis (chronic) without hematuria: Secondary | ICD-10-CM | POA: Diagnosis not present

## 2017-10-09 DIAGNOSIS — Z0000 Encounter for general adult medical examination without abnormal findings: Secondary | ICD-10-CM | POA: Insufficient documentation

## 2017-10-09 LAB — CBC
HCT: 41.2 % (ref 36.0–46.0)
Hemoglobin: 13.9 g/dL (ref 12.0–15.0)
MCH: 32 pg (ref 26.0–34.0)
MCHC: 33.7 g/dL (ref 30.0–36.0)
MCV: 94.9 fL (ref 78.0–100.0)
PLATELETS: 194 10*3/uL (ref 150–400)
RBC: 4.34 MIL/uL (ref 3.87–5.11)
RDW: 11.9 % (ref 11.5–15.5)
WBC: 3 10*3/uL — ABNORMAL LOW (ref 4.0–10.5)

## 2017-10-09 LAB — TSH: TSH: 2.921 u[IU]/mL (ref 0.350–4.500)

## 2017-10-09 LAB — COMPREHENSIVE METABOLIC PANEL
ALT: 24 U/L (ref 0–44)
ANION GAP: 9 (ref 5–15)
AST: 25 U/L (ref 15–41)
Albumin: 4.3 g/dL (ref 3.5–5.0)
Alkaline Phosphatase: 57 U/L (ref 38–126)
BUN: 13 mg/dL (ref 6–20)
CHLORIDE: 105 mmol/L (ref 98–111)
CO2: 28 mmol/L (ref 22–32)
CREATININE: 0.8 mg/dL (ref 0.44–1.00)
Calcium: 9.5 mg/dL (ref 8.9–10.3)
Glucose, Bld: 87 mg/dL (ref 70–99)
POTASSIUM: 3.8 mmol/L (ref 3.5–5.1)
Sodium: 142 mmol/L (ref 135–145)
Total Bilirubin: 1 mg/dL (ref 0.3–1.2)
Total Protein: 7.9 g/dL (ref 6.5–8.1)

## 2017-10-09 LAB — LIPID PANEL
CHOL/HDL RATIO: 2.1 ratio
CHOLESTEROL: 166 mg/dL (ref 0–200)
HDL: 79 mg/dL (ref >40)
LDL Cholesterol: 78 mg/dL (ref 0–99)
TRIGLYCERIDES: 44 mg/dL (ref <150)
VLDL: 9 mg/dL (ref 0–40)

## 2017-10-10 LAB — VITAMIN D 25 HYDROXY (VIT D DEFICIENCY, FRACTURES): Vit D, 25-Hydroxy: 34.1 ng/mL (ref 30.0–100.0)

## 2017-10-14 MED FILL — HYDROXYCHLOROQUINE SULFATE: 200 | 90 days supply | Qty: 135 | Fill #2

## 2017-10-15 ENCOUNTER — Encounter (INDEPENDENT_AMBULATORY_CARE_PROVIDER_SITE_OTHER): Payer: Self-pay | Admitting: *Deleted

## 2017-10-22 ENCOUNTER — Other Ambulatory Visit (HOSPITAL_COMMUNITY)
Admission: RE | Admit: 2017-10-22 | Discharge: 2017-10-22 | Disposition: A | Discharge status: Home / Self Care | Payer: 59 | Source: Ambulatory Visit | Attending: Pulmonary Disease | Admitting: Pulmonary Disease

## 2017-10-22 DIAGNOSIS — N390 Urinary tract infection, site not specified: Secondary | ICD-10-CM | POA: Diagnosis not present

## 2017-10-22 LAB — URINALYSIS, ROUTINE W REFLEX MICROSCOPIC
BACTERIA UA: NONE SEEN
Bilirubin Urine: NEGATIVE
GLUCOSE, UA: NEGATIVE mg/dL
Hgb urine dipstick: NEGATIVE
Ketones, ur: NEGATIVE mg/dL
Nitrite: POSITIVE — AB
Protein, ur: NEGATIVE mg/dL
Specific Gravity, Urine: 1.005 (ref 1.005–1.030)
pH: 8 (ref 5.0–8.0)

## 2017-10-22 MED FILL — CIPROFLOXACIN HCL 250 MG TA: 250 | 7 days supply | Qty: 14 | Fill #0

## 2017-10-25 LAB — URINE CULTURE: Culture: >=100000 — AB

## 2017-11-11 DIAGNOSIS — M3210 Systemic lupus erythematosus, organ or system involvement unspecified: Secondary | ICD-10-CM | POA: Diagnosis not present

## 2017-11-11 DIAGNOSIS — H5213 Myopia, bilateral: Secondary | ICD-10-CM | POA: Diagnosis not present

## 2017-11-11 DIAGNOSIS — H524 Presbyopia: Secondary | ICD-10-CM | POA: Diagnosis not present

## 2017-11-11 DIAGNOSIS — Z79899 Other long term (current) drug therapy: Secondary | ICD-10-CM | POA: Diagnosis not present

## 2017-11-11 DIAGNOSIS — H52203 Unspecified astigmatism, bilateral: Secondary | ICD-10-CM | POA: Diagnosis not present

## 2017-11-19 ENCOUNTER — Other Ambulatory Visit: Payer: 59 | Admitting: Obstetrics and Gynecology

## 2017-11-19 ENCOUNTER — Telehealth: Payer: 59 | Admitting: Family

## 2017-11-19 DIAGNOSIS — J329 Chronic sinusitis, unspecified: Secondary | ICD-10-CM

## 2017-11-19 MED ORDER — DOXYCYCLINE HYCLATE 100 MG PO TABS: 100.0000 mg | ORAL_TABLET | Freq: Two times a day (BID) | ORAL | 0 refills | Status: DC

## 2017-11-19 NOTE — Progress Notes (Signed)

## 2017-11-28 ENCOUNTER — Other Ambulatory Visit: Payer: Self-pay | Admitting: Obstetrics and Gynecology

## 2017-11-28 DIAGNOSIS — Z1231 Encounter for screening mammogram for malignant neoplasm of breast: Secondary | ICD-10-CM

## 2017-12-04 ENCOUNTER — Ambulatory Visit (INDEPENDENT_AMBULATORY_CARE_PROVIDER_SITE_OTHER): Payer: Self-pay

## 2017-12-04 DIAGNOSIS — Z1211 Encounter for screening for malignant neoplasm of colon: Secondary | ICD-10-CM

## 2017-12-04 MED ORDER — CLENPIQ 10-3.5-12 MG-GM -GM/160ML PO SOLN: 1.0000 | Freq: Once | ORAL | 0 refills | Status: AC

## 2017-12-04 MED FILL — CLENPIQ 10-3.5-12 MG-GM -GM: 10-3.5-12 M | 1 days supply | Qty: 320 | Fill #0

## 2017-12-04 NOTE — Progress Notes (Signed)
Ok to schedule.

## 2017-12-04 NOTE — Progress Notes (Addendum)
Gastroenterology Pre-Procedure Review  Request Date:12/04/17 Requesting Physician: Dr.Hawkins- no previous tcs  PATIENT REVIEW QUESTIONS: The patient responded to the following health history questions as indicated:    1. Diabetes Melitis: no 2. Joint replacements in the past 12 months: no 3. Major health problems in the past 3 months: no 4. Has an artificial valve or MVP: no 5. Has a defibrillator: no 6. Has been advised in past to take antibiotics in advance of a procedure like teeth cleaning: no 7. Family history of colon cancer: yes (paternal aunt and great grandmother)  50. Alcohol Use: yes (occasionally)  9. History of sleep apnea: no  10. History of coronary artery or other vascular stents placed within the last 12 months: no 11. History of any prior anesthesia complications: no    MEDICATIONS & ALLERGIES:    Patient reports the following regarding taking any blood thinners:   Plavix? no Aspirin? no Coumadin? no Brilinta? no Xarelto? no Eliquis? no Pradaxa? no Savaysa? no Effient? no  Patient confirms/reports the following medications:  Current Outpatient Medications  Medication Sig Dispense Refill  . Aloe Vera 500 MG CAPS Take by mouth daily.    . calcium-vitamin D (OSCAL WITH D) 500-200 MG-UNIT per tablet Take 1 tablet by mouth every morning.     . clobetasol cream (TEMOVATE) 4.26 % Apply 1 application topically at bedtime.     . fluticasone (CUTIVATE) 0.005 % ointment Apply 1 application topically at bedtime.     . hydroxychloroquine (PLAQUENIL) 200 MG tablet Take 200 mg by mouth every morning.     Marland Kitchen ibuprofen (ADVIL,MOTRIN) 200 MG tablet Take 200 mg by mouth every 6 (six) hours as needed for mild pain.    Marland Kitchen levofloxacin (LEVAQUIN) 500 MG tablet     . loratadine (CLARITIN) 5 MG chewable tablet Chew 5 mg by mouth every morning.    . Multiple Vitamins-Minerals (MULTIVITAMINS THER. W/MINERALS) TABS tablet Take 1 tablet by mouth every morning.     . Probiotic Product  (PROBIOTIC DAILY PO) Take 1 capsule by mouth every other day.    . triamcinolone (NASACORT) 55 MCG/ACT AERO nasal inhaler Place 1 spray into the nose every morning.      No current facility-administered medications for this visit.     Patient confirms/reports the following allergies:  Allergies  Allergen Reactions  . Sulfamethoxazole-Trimethoprim     Joint pain.  . Cephalexin Hives and Rash  . Penicillins Hives and Rash    No orders of the defined types were placed in this encounter.   AUTHORIZATION INFORMATION Primary Insurance: [place],  [place] #: [id number] Pre-Cert / Josem Kaufmann required: no per Casilda Carls / Auth #: [id number]  SCHEDULE INFORMATION: Procedure has been scheduled as follows:  Date: 02/06/18, Time: 8:30 Location: [facility]  This Gastroenterology Pre-Precedure Review Form is being routed to the following provider(s): Walden Field NP

## 2017-12-04 NOTE — Patient Instructions (Signed)
CLENPIQ SPLIT PREP INSTRUCTIONS   Patient Name:  Kara Bradley Date of procedure:  02/06/18 Time to register at [facility]:  7:30am Provider:  Dr. Fields   Please notify us immediately if you are diabetic, take iron supplements, or if you are on coumadin or any blood thinners.   Note: Do NOT refrigerate or freeze CLENPIQ. CLENPIQ is ready to drink. There is no need to add any other liquid or mix the medicine in the bottle before you start dosing.   02/05/18  1 Day prior to procedure:     CLEAR LIQUIDS ALL DAY--NO SOLID FOODS OR DAIRY PRODUCTS! See list of liquids that are allowed and items that are NOT allowed below.     You must drink plenty of CLEAR LIQUIDS starting before your bowel prep. It is important to stay adequately hydrated before, during, and after your bowel prep for the prep to work effectively!    At 5:00 PM Begin the prep as follows:    1. Drink one bottle of premixed CLENPIQ right from the bottle. 2. Drink at least five (5) 8-ounce drinks of clear liquids of your choice within the next 5 hours   Continue clear liquids.    02/06/18  Day of Procedure   You may take TYLENOL products. Please continue your regular medications unless we have instructed you otherwise.    5 hours before procedure @ 3:30am:  1. Drink second bottle of premixed CLENPIQ right from the bottle.   2. Drink at least three (3) 8-ounce drinks of clear liquids of your choice within the next 2 hours. You can drink more if needed.   3 hours before your procedure time @ 5:30 am: Stop drinking all liquids, nothing by mouth at this point.  Please note, on the day of your procedure you MUST be accompanied by an adult who is willing to assume responsibility for you at time of discharge. If you do not have such person with you, your procedure will have to be rescheduled.    Please leave ALL jewelry at home prior to coming to the hospital for your procedure.   *It is your responsibility to check with your insurance company for the benefits of coverage you have for this procedure. Unfortunately, not all insurance companies have benefits to cover all or part of these types of procedures. It is your responsibility to check your benefits, however we will be glad to assist you with any codes your insurance company may need.   Please note that most insurance companies will not cover a screening colonoscopy for people under the age of 50  For example, with some insurance companies you may have benefits for a screening colonoscopy, but if polyps are found the diagnosis will change and then you may have a deductible that will need to be met. Please make sure you check your benefits for screening colonoscopy as well as a diagnostic colonoscopy.   CLEAR LIQUIDS: (NO RED or PURPLE) Water  Jello   Apple Juice  White Grape Juice   Kool-Aid Soft drinks  Banana popsicles Sports Drink  Black coffee (No cream or milk) Tea (No cream or milk)  Broth (fat free beef/chicken/vegetable)  Clear liquids allow you to see your fingers on the other side of the glass.  Be sure they are NOT RED or PURPLE in color, cloudy, but CLEAR.  Do Not Eat: Dairy products of any kind Cranberry juice Tomato or V8 Juice  Orange Juice   Grapefruit   Juice Red Grape Juice Alcohol   Non-dairy creamer Solid foods like cereal, oatmeal, yogurt, fruits, vegetables, creamed soups, eggs, bread, etc   HELPFUL HINTS TO MAKE DRINKING EASIER: -Trying drinking through a straw. -If you become nauseated, try consuming smaller amounts or stretch out the time between glasses.  Stop for 30 minutes & slowly start back drinking.  Call our office with any questions or concerns at [phone].  Thank You

## 2017-12-11 ENCOUNTER — Ambulatory Visit (HOSPITAL_COMMUNITY)
Admission: RE | Admit: 2017-12-11 | Discharge: 2017-12-11 | Disposition: A | Discharge status: Home / Self Care | Payer: 59 | Source: Ambulatory Visit | Attending: Obstetrics and Gynecology | Admitting: Obstetrics and Gynecology

## 2017-12-11 DIAGNOSIS — Z1231 Encounter for screening mammogram for malignant neoplasm of breast: Secondary | ICD-10-CM | POA: Diagnosis not present

## 2018-01-16 ENCOUNTER — Encounter: Payer: Self-pay | Admitting: Obstetrics and Gynecology

## 2018-01-16 ENCOUNTER — Ambulatory Visit (INDEPENDENT_AMBULATORY_CARE_PROVIDER_SITE_OTHER): Payer: 59 | Admitting: Obstetrics and Gynecology

## 2018-01-16 VITALS — BP 119/72 | HR 84 | Ht 62.0 in | Wt 109.6 lb

## 2018-01-16 DIAGNOSIS — Z01419 Encounter for gynecological examination (general) (routine) without abnormal findings: Secondary | ICD-10-CM

## 2018-01-16 DIAGNOSIS — Z1212 Encounter for screening for malignant neoplasm of rectum: Secondary | ICD-10-CM

## 2018-01-16 DIAGNOSIS — Z1211 Encounter for screening for malignant neoplasm of colon: Secondary | ICD-10-CM

## 2018-01-16 LAB — HEMOCCULT GUIAC POC 1CARD (OFFICE): Fecal Occult Blood, POC: NEGATIVE
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
## 2018-01-21 ENCOUNTER — Encounter (HOSPITAL_COMMUNITY): Payer: Self-pay | Admitting: Anesthesiology

## 2018-01-26 MED FILL — HYDROXYCHLOROQUINE SULFATE: 200 | 90 days supply | Qty: 135 | Fill #3

## 2018-01-29 ENCOUNTER — Other Ambulatory Visit (HOSPITAL_COMMUNITY)
Admission: RE | Admit: 2018-01-29 | Discharge: 2018-01-29 | Disposition: A | Discharge status: Home / Self Care | Payer: 59 | Source: Ambulatory Visit | Attending: Pulmonary Disease | Admitting: Pulmonary Disease

## 2018-01-29 DIAGNOSIS — N390 Urinary tract infection, site not specified: Secondary | ICD-10-CM | POA: Insufficient documentation

## 2018-01-31 LAB — URINE CULTURE

## 2018-02-05 DIAGNOSIS — Z79899 Other long term (current) drug therapy: Secondary | ICD-10-CM | POA: Diagnosis not present

## 2018-02-05 DIAGNOSIS — L932 Other local lupus erythematosus: Secondary | ICD-10-CM | POA: Diagnosis not present

## 2018-02-05 DIAGNOSIS — M810 Age-related osteoporosis without current pathological fracture: Secondary | ICD-10-CM | POA: Diagnosis not present

## 2018-02-06 ENCOUNTER — Encounter (HOSPITAL_COMMUNITY): Payer: Self-pay | Admitting: *Deleted

## 2018-02-06 ENCOUNTER — Other Ambulatory Visit: Payer: Self-pay

## 2018-02-06 ENCOUNTER — Ambulatory Visit (HOSPITAL_COMMUNITY)
Admission: RE | Admit: 2018-02-06 | Discharge: 2018-02-06 | Disposition: A | Discharge status: Home / Self Care | Payer: 59 | Source: Ambulatory Visit | Attending: Gastroenterology | Admitting: Gastroenterology

## 2018-02-06 ENCOUNTER — Encounter (HOSPITAL_COMMUNITY)
Admission: RE | Disposition: A | Discharge status: Home / Self Care | Payer: Self-pay | Source: Ambulatory Visit | Attending: Gastroenterology

## 2018-02-06 DIAGNOSIS — K648 Other hemorrhoids: Secondary | ICD-10-CM | POA: Diagnosis not present

## 2018-02-06 DIAGNOSIS — Z79899 Other long term (current) drug therapy: Secondary | ICD-10-CM | POA: Insufficient documentation

## 2018-02-06 DIAGNOSIS — K644 Residual hemorrhoidal skin tags: Secondary | ICD-10-CM | POA: Insufficient documentation

## 2018-02-06 DIAGNOSIS — Q438 Other specified congenital malformations of intestine: Secondary | ICD-10-CM | POA: Insufficient documentation

## 2018-02-06 DIAGNOSIS — Z880 Allergy status to penicillin: Secondary | ICD-10-CM | POA: Insufficient documentation

## 2018-02-06 DIAGNOSIS — Z1211 Encounter for screening for malignant neoplasm of colon: Secondary | ICD-10-CM | POA: Diagnosis not present

## 2018-02-06 DIAGNOSIS — Z881 Allergy status to other antibiotic agents status: Secondary | ICD-10-CM | POA: Diagnosis not present

## 2018-02-06 DIAGNOSIS — K219 Gastro-esophageal reflux disease without esophagitis: Secondary | ICD-10-CM | POA: Diagnosis not present

## 2018-02-06 DIAGNOSIS — N200 Calculus of kidney: Secondary | ICD-10-CM | POA: Diagnosis not present

## 2018-02-06 DIAGNOSIS — Z882 Allergy status to sulfonamides status: Secondary | ICD-10-CM | POA: Insufficient documentation

## 2018-02-06 DIAGNOSIS — M329 Systemic lupus erythematosus, unspecified: Secondary | ICD-10-CM | POA: Diagnosis not present

## 2018-02-06 DIAGNOSIS — R350 Frequency of micturition: Secondary | ICD-10-CM | POA: Diagnosis not present

## 2018-02-06 HISTORY — PX: COLONOSCOPY: SHX5424

## 2018-02-06 LAB — HM COLONOSCOPY

## 2018-02-06 SURGERY — COLONOSCOPY
Anesthesia: Moderate Sedation

## 2018-02-06 MED ORDER — MEPERIDINE HCL 100 MG/ML IJ SOLN
INTRAMUSCULAR | Status: AC
  Filled 2018-02-06: qty 2

## 2018-02-06 MED ORDER — ONDANSETRON HCL 4 MG/2ML IJ SOLN
INTRAMUSCULAR | Status: DC | PRN
  Administered 2018-02-06: 4 mg via INTRAVENOUS

## 2018-02-06 MED ORDER — SODIUM CHLORIDE 0.9 % IV SOLN
INTRAVENOUS | Status: DC
  Administered 2018-02-06: 08:00:00 via INTRAVENOUS

## 2018-02-06 MED ORDER — STERILE WATER FOR IRRIGATION IR SOLN
Status: DC | PRN
  Administered 2018-02-06: 1.5 mL

## 2018-02-06 MED ORDER — MEPERIDINE HCL 100 MG/ML IJ SOLN
INTRAMUSCULAR | Status: DC | PRN
  Administered 2018-02-06 (×2): 25 mg

## 2018-02-06 MED ORDER — ONDANSETRON HCL 4 MG/2ML IJ SOLN
INTRAMUSCULAR | Status: AC
  Filled 2018-02-06: qty 2

## 2018-02-06 MED ORDER — MIDAZOLAM HCL 5 MG/5ML IJ SOLN
INTRAMUSCULAR | Status: AC
  Filled 2018-02-06: qty 10

## 2018-02-06 MED ORDER — MIDAZOLAM HCL 5 MG/5ML IJ SOLN
INTRAMUSCULAR | Status: DC | PRN
  Administered 2018-02-06: 1 mg via INTRAVENOUS
  Administered 2018-02-06 (×2): 2 mg via INTRAVENOUS
  Administered 2018-02-06: 1 mg via INTRAVENOUS

## 2018-02-06 NOTE — Discharge Instructions (Signed)
You have moderate size EXTERNAL AND SMALL internal hemorrhoids. YOU DID NOT HAVE ANY POLYPS.   DRINK WATER TO KEEP YOUR URINE LIGHT YELLOW.  FOLLOW A HIGH FIBER DIET. AVOID ITEMS THAT CAUSE BLOATING. SEE INFO BELOW.  USE PREPARATION H FOUR TIMES  A DAY IF NEEDED TO RELIEVE RECTAL PAIN/PRESSURE/BLEEDING.  Next colonoscopy in 10 years.  Colonoscopy Care After Read the instructions outlined below and refer to this sheet in the next week. These discharge instructions provide you with general information on caring for yourself after you leave the hospital. While your treatment has been planned according to the most current medical practices available, unavoidable complications occasionally occur. If you have any problems or questions after discharge, call DR. Tristine Langi, [phone].  ACTIVITY  You may resume your regular activity, but move at a slower pace for the next 24 hours.   Take frequent rest periods for the next 24 hours.   Walking will help get rid of the air and reduce the bloated feeling in your belly (abdomen).   No driving for 24 hours (because of the medicine (anesthesia) used during the test).   You may shower.   Do not sign any important legal documents or operate any machinery for 24 hours (because of the anesthesia used during the test).    NUTRITION  Drink plenty of fluids.   You may resume your normal diet as instructed by your doctor.   Begin with a light meal and progress to your normal diet. Heavy or fried foods are harder to digest and may make you feel sick to your stomach (nauseated).   Avoid alcoholic beverages for 24 hours or as instructed.    MEDICATIONS  You may resume your normal medications.   WHAT YOU CAN EXPECT TODAY  Some feelings of bloating in the abdomen.   Passage of more gas than usual.   Spotting of blood in your stool or on the toilet paper  .  IF YOU HAD POLYPS REMOVED DURING THE COLONOSCOPY:  Eat a soft diet IF YOU HAVE  NAUSEA, BLOATING, ABDOMINAL PAIN, OR VOMITING.    FINDING OUT THE RESULTS OF YOUR TEST Not all test results are available during your visit. DR. Oneida Alar WILL CALL YOU WITHIN 14 DAYS OF YOUR PROCEDUE WITH YOUR RESULTS. Do not assume everything is normal if you have not heard from DR. Kiefer Opheim, CALL HER OFFICE AT [phone].  SEEK IMMEDIATE MEDICAL ATTENTION AND CALL THE OFFICE: [phone] IF:  You have more than a spotting of blood in your stool.   Your belly is swollen (abdominal distention).   You are nauseated or vomiting.   You have a temperature over 101F.   You have abdominal pain or discomfort that is severe or gets worse throughout the day.  High-Fiber Diet A high-fiber diet changes your normal diet to include more whole grains, legumes, fruits, and vegetables. Changes in the diet involve replacing refined carbohydrates with unrefined foods. The calorie level of the diet is essentially unchanged. The Dietary Reference Intake (recommended amount) for adult males is 38 grams per day. For adult females, it is 25 grams per day. Pregnant and lactating women should consume 28 grams of fiber per day. Fiber is the intact part of a plant that is not broken down during digestion. Functional fiber is fiber that has been isolated from the plant to provide a beneficial effect in the body. PURPOSE  Increase stool bulk.   Ease and regulate bowel movements.   Lower cholesterol.   REDUCE  RISK OF COLON CANCER  INDICATIONS THAT YOU NEED MORE FIBER  Constipation and hemorrhoids.   Uncomplicated diverticulosis (intestine condition) and irritable bowel syndrome.   Weight management.   As a protective measure against hardening of the arteries (atherosclerosis), diabetes, and cancer.   GUIDELINES FOR INCREASING FIBER IN THE DIET  Start adding fiber to the diet slowly. A gradual increase of about 5 more grams (2 slices of whole-wheat bread, 2 servings of most fruits or vegetables, or 1 bowl  of high-fiber cereal) per day is best. Too rapid an increase in fiber may result in constipation, flatulence, and bloating.   Drink enough water and fluids to keep your urine clear or pale yellow. Water, juice, or caffeine-free drinks are recommended. Not drinking enough fluid may cause constipation.   Eat a variety of high-fiber foods rather than one type of fiber.   Try to increase your intake of fiber through using high-fiber foods rather than fiber pills or supplements that contain small amounts of fiber.   The goal is to change the types of food eaten. Do not supplement your present diet with high-fiber foods, but replace foods in your present diet.    INCLUDE A VARIETY OF FIBER SOURCES  Replace refined and processed grains with whole grains, canned fruits with fresh fruits, and incorporate other fiber sources. White rice, white breads, and most bakery goods contain little or no fiber.   Brown whole-grain rice, buckwheat oats, and many fruits and vegetables are all good sources of fiber. These include: broccoli, Brussels sprouts, cabbage, cauliflower, beets, sweet potatoes, white potatoes (skin on), carrots, tomatoes, eggplant, squash, berries, fresh fruits, and dried fruits.   Cereals appear to be the richest source of fiber. Cereal fiber is found in whole grains and bran. Bran is the fiber-rich outer coat of cereal grain, which is largely removed in refining. In whole-grain cereals, the bran remains. In breakfast cereals, the largest amount of fiber is found in those with "bran" in their names. The fiber content is sometimes indicated on the label.   You may need to include additional fruits and vegetables each day.   In baking, for 1 cup white flour, you may use the following substitutions:   1 cup whole-wheat flour minus 2 tablespoons.   1/2 cup white flour plus 1/2 cup whole-wheat flour.   Hemorrhoids Hemorrhoids are dilated (enlarged) veins around the rectum. Sometimes clots  will form in the veins. This makes them swollen and painful. These are called thrombosed hemorrhoids. Causes of hemorrhoids include:  Constipation.   Straining to have a bowel movement.   HEAVY LIFTING  HOME CARE INSTRUCTIONS  Eat a well balanced diet and drink 6 to 8 glasses of water every day to avoid constipation. You may also use a bulk laxative.   Avoid straining to have bowel movements.   Keep anal area dry and clean.   Do not use a donut shaped pillow or sit on the toilet for long periods. This increases blood pooling and pain.   Move your bowels when your body has the urge; this will require less straining and will decrease pain and pressure.
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
## 2018-02-06 NOTE — Op Note (Signed)
[facility] Patient Name: Kara Bradley Procedure Date: 02/06/2018 8:37 AM MRN: [id number] Date of Birth: 1965/05/11 Attending MD: Barney Drain MD, MD CSN: [id number] Age: 52 Admit Type: Outpatient Procedure:                Colonoscopy, SCREENING Indications:              Screening for colorectal malignant neoplasm Providers:                Barney Drain MD, MD, Janeece Riggers, RN, Gerome Sam, RN, Nelma Rothman, Technician Referring MD:             Jasper Loser. Luan Pulling MD, MD Medicines:                Ondansetron 4 mg IV, Meperidine 50 mg IV, Midazolam                            6 mg IV Complications:            No immediate complications. Estimated Blood Loss:     Estimated blood loss: none. Procedure:                Pre-Anesthesia Assessment:                           - Prior to the procedure, a History and Physical                            was performed, and patient medications and                            allergies were reviewed. The patient's tolerance of                            previous anesthesia was also reviewed. The risks                            and benefits of the procedure and the sedation                            options and risks were discussed with the patient.                            All questions were answered, and informed consent                            was obtained. Prior Anticoagulants: The patient has                            taken no previous anticoagulant or antiplatelet                            agents. ASA Grade Assessment: II - A patient with  mild systemic disease. After reviewing the risks                            and benefits, the patient was deemed in                            satisfactory condition to undergo the procedure.                            After obtaining informed consent, the colonoscope                            was passed under direct vision. Throughout the                            procedure, the patient's blood pressure, pulse, and                            oxygen saturations were monitored continuously. The                            [id number]-[id number] ([id number]) scope was introduced through                            the anus and advanced to the the cecum, identified                            by appendiceal orifice and ileocecal valve. The                            colonoscopy was technically difficult and complex                            due to restricted mobility of the colon and                            significant looping. Successful completion of the                            procedure was aided by increasing the dose of                            sedation medication, straightening and shortening                            the scope to obtain bowel loop reduction and                            COLOWRAP. The patient tolerated the procedure                            fairly well. The quality of the bowel preparation  was excellent. The ileocecal valve, appendiceal                            orifice, and rectum were photographed. Scope In: 8:54:01 AM Scope Out: 9:15:13 AM Scope Withdrawal Time: 0 hours 14 minutes 47 seconds  Total Procedure Duration: 0 hours 21 minutes 12 seconds  Findings:      The recto-sigmoid colon, sigmoid colon and descending colon were       significantly redundant.      Internal hemorrhoids were found. The hemorrhoids were small.      External hemorrhoids were found. The hemorrhoids were moderate.      The exam was otherwise without abnormality. Impression:               - Redundant colon.                           - Internal hemorrhoids.                           - External hemorrhoids.                           - The examination was otherwise normal. Moderate Sedation:      Moderate (conscious) sedation was administered by the endoscopy nurse       and supervised by the endoscopist.  The following parameters were       monitored: oxygen saturation, heart rate, blood pressure, and response       to care. Total physician intraservice time was 36 minutes. Recommendation:           - Patient has a contact number available for                            emergencies. The signs and symptoms of potential                            delayed complications were discussed with the                            patient. Return to normal activities tomorrow.                            Written discharge instructions were provided to the                            patient.                           - High fiber diet.                           - Continue present medications.                           - Repeat colonoscopy in 10 years for surveillance. Procedure Code(s):        --- Professional ---  [phone], Colonoscopy, flexible; diagnostic, including                            collection of specimen(s) by brushing or washing,                            when performed (separate procedure)                           99153, Moderate sedation; each additional 15                            minutes intraservice time                           G0500, Moderate sedation services provided by the                            same physician or other qualified health care                            professional performing a gastrointestinal                            endoscopic service that sedation supports,                            requiring the presence of an independent trained                            observer to assist in the monitoring of the                            patient's level of consciousness and physiological                            status; initial 15 minutes of intra-service time;                            patient age 61 years or older (additional time may                            be reported with [phone], as appropriate) Diagnosis Code(s):        ---  Professional ---                           Z12.11, Encounter for screening for malignant                            neoplasm of colon                           K64.4, Residual hemorrhoidal skin tags                           K64.8,  Other hemorrhoids                           Q43.8, Other specified congenital malformations of                            intestine CPT copyright 2018 [employer] Association. All rights reserved. The codes documented in this report are preliminary and upon coder review may  be revised to meet current compliance requirements. Barney Drain, MD Barney Drain MD, MD 02/06/2018 9:31:28 AM This report has been signed electronically. Number of Addenda: 0

## 2018-02-12 ENCOUNTER — Encounter (HOSPITAL_COMMUNITY): Payer: Self-pay | Admitting: Gastroenterology

## 2018-02-16 MED FILL — PROLIA 60 MG/ML SOLN: 60 | 180 days supply | Qty: 1 | Fill #0

## 2018-02-19 DIAGNOSIS — M810 Age-related osteoporosis without current pathological fracture: Secondary | ICD-10-CM | POA: Diagnosis not present

## 2018-03-10 DIAGNOSIS — D352 Benign neoplasm of pituitary gland: Secondary | ICD-10-CM | POA: Diagnosis not present

## 2018-03-26 DIAGNOSIS — D225 Melanocytic nevi of trunk: Secondary | ICD-10-CM | POA: Diagnosis not present

## 2018-03-26 DIAGNOSIS — L930 Discoid lupus erythematosus: Secondary | ICD-10-CM | POA: Diagnosis not present

## 2018-03-26 DIAGNOSIS — Z1283 Encounter for screening for malignant neoplasm of skin: Secondary | ICD-10-CM | POA: Diagnosis not present

## 2018-03-31 MED FILL — CLOBETASOL PROPIONATE 0.05: 0.05 | 30 days supply | Qty: 60 | Fill #0

## 2018-04-22 MED FILL — HYDROXYCHLOROQUINE SULFATE: 200 | 90 days supply | Qty: 135 | Fill #0

## 2018-07-29 MED FILL — HYDROXYCHLOROQUINE SULFATE: 200 | 90 days supply | Qty: 135 | Fill #1

## 2018-08-06 DIAGNOSIS — L932 Other local lupus erythematosus: Secondary | ICD-10-CM | POA: Diagnosis not present

## 2018-08-06 DIAGNOSIS — M810 Age-related osteoporosis without current pathological fracture: Secondary | ICD-10-CM | POA: Diagnosis not present

## 2018-08-06 DIAGNOSIS — Z79899 Other long term (current) drug therapy: Secondary | ICD-10-CM | POA: Diagnosis not present

## 2018-08-10 DIAGNOSIS — L932 Other local lupus erythematosus: Secondary | ICD-10-CM | POA: Diagnosis not present

## 2018-08-10 DIAGNOSIS — Z79899 Other long term (current) drug therapy: Secondary | ICD-10-CM | POA: Diagnosis not present

## 2018-08-19 MED FILL — PROLIA 60 MG/ML SOLN: 60 | 180 days supply | Qty: 1 | Fill #1

## 2018-08-25 DIAGNOSIS — M810 Age-related osteoporosis without current pathological fracture: Secondary | ICD-10-CM | POA: Diagnosis not present

## 2018-09-25 ENCOUNTER — Other Ambulatory Visit: Payer: Self-pay

## 2018-10-12 DIAGNOSIS — Z0000 Encounter for general adult medical examination without abnormal findings: Secondary | ICD-10-CM | POA: Diagnosis not present

## 2018-10-14 ENCOUNTER — Other Ambulatory Visit (HOSPITAL_COMMUNITY)
Admission: RE | Admit: 2018-10-14 | Discharge: 2018-10-14 | Disposition: A | Discharge status: Home / Self Care | Payer: 59 | Source: Ambulatory Visit | Attending: Pulmonary Disease | Admitting: Pulmonary Disease

## 2018-10-14 DIAGNOSIS — R895 Abnormal microbiological findings in specimens from other organs, systems and tissues: Secondary | ICD-10-CM | POA: Insufficient documentation

## 2018-10-14 DIAGNOSIS — N3010 Interstitial cystitis (chronic) without hematuria: Secondary | ICD-10-CM | POA: Insufficient documentation

## 2018-10-14 DIAGNOSIS — Z0000 Encounter for general adult medical examination without abnormal findings: Secondary | ICD-10-CM | POA: Insufficient documentation

## 2018-10-14 DIAGNOSIS — M329 Systemic lupus erythematosus, unspecified: Secondary | ICD-10-CM | POA: Insufficient documentation

## 2018-10-14 LAB — LIPID PANEL
Cholesterol: 166 mg/dL (ref 0–200)
HDL: 77 mg/dL (ref >40)
LDL Cholesterol: 80 mg/dL (ref 0–99)
Total CHOL/HDL Ratio: 2.2 RATIO
Triglycerides: 47 mg/dL (ref <150)
VLDL: 9 mg/dL (ref 0–40)

## 2018-10-14 LAB — COMPREHENSIVE METABOLIC PANEL
ALT: 23 U/L (ref 0–44)
AST: 24 U/L (ref 15–41)
Albumin: 4.2 g/dL (ref 3.5–5.0)
Alkaline Phosphatase: 37 U/L — ABNORMAL LOW (ref 38–126)
BUN: 14 mg/dL (ref 6–20)
CO2: 29 mmol/L (ref 22–32)
Calcium: 9.1 mg/dL (ref 8.9–10.3)
Chloride: 110 mmol/L (ref 98–111)
Creatinine, Ser: 0.68 mg/dL (ref 0.44–1.00)
GFR calc Af Amer: >60 mL/min (ref >60)
GFR calc non Af Amer: >60 mL/min (ref >60)
Glucose, Bld: 94 mg/dL (ref 70–99)
Potassium: 4.4 mmol/L (ref 3.5–5.1)
Sodium: 139 mmol/L (ref 135–145)
Total Bilirubin: 0.7 mg/dL (ref 0.3–1.2)
Total Protein: 7.5 g/dL (ref 6.5–8.1)

## 2018-10-14 LAB — CBC
HCT: 41.7 % (ref 36.0–46.0)
Hemoglobin: 13.6 g/dL (ref 12.0–15.0)
MCH: 31.9 pg (ref 26.0–34.0)
MCHC: 32.6 g/dL (ref 30.0–36.0)
MCV: 97.7 fL (ref 80.0–100.0)
Platelets: 190 10*3/uL (ref 150–400)
RBC: 4.27 MIL/uL (ref 3.87–5.11)
RDW: 11.7 % (ref 11.5–15.5)
WBC: 3.4 10*3/uL — ABNORMAL LOW (ref 4.0–10.5)
nRBC: 0 % (ref 0.0–0.2)

## 2018-10-14 LAB — URINALYSIS, ROUTINE W REFLEX MICROSCOPIC
Bilirubin Urine: NEGATIVE
Glucose, UA: NEGATIVE mg/dL
Hgb urine dipstick: NEGATIVE
Ketones, ur: NEGATIVE mg/dL
Leukocytes,Ua: NEGATIVE
Nitrite: NEGATIVE
Protein, ur: NEGATIVE mg/dL
Specific Gravity, Urine: 1.006 (ref 1.005–1.030)
pH: 7 (ref 5.0–8.0)

## 2018-10-14 LAB — TSH: TSH: 4.161 u[IU]/mL (ref 0.350–4.500)

## 2018-10-15 LAB — URINE CULTURE: Culture: NO GROWTH

## 2018-10-22 MED FILL — HYDROXYCHLOROQUINE SULFATE: 200 | 90 days supply | Qty: 135 | Fill #2

## 2018-10-28 ENCOUNTER — Other Ambulatory Visit (HOSPITAL_COMMUNITY): Payer: Self-pay | Admitting: Pulmonary Disease

## 2018-10-28 DIAGNOSIS — Z1231 Encounter for screening mammogram for malignant neoplasm of breast: Secondary | ICD-10-CM

## 2018-11-12 DIAGNOSIS — H524 Presbyopia: Secondary | ICD-10-CM | POA: Diagnosis not present

## 2018-11-12 DIAGNOSIS — Z79899 Other long term (current) drug therapy: Secondary | ICD-10-CM | POA: Diagnosis not present

## 2018-11-12 DIAGNOSIS — H52203 Unspecified astigmatism, bilateral: Secondary | ICD-10-CM | POA: Diagnosis not present

## 2018-11-12 DIAGNOSIS — H5213 Myopia, bilateral: Secondary | ICD-10-CM | POA: Diagnosis not present

## 2018-11-12 DIAGNOSIS — M3210 Systemic lupus erythematosus, organ or system involvement unspecified: Secondary | ICD-10-CM | POA: Diagnosis not present

## 2018-12-18 ENCOUNTER — Ambulatory Visit (HOSPITAL_COMMUNITY)
Admission: RE | Admit: 2018-12-18 | Discharge: 2018-12-18 | Disposition: A | Discharge status: Home / Self Care | Payer: 59 | Source: Ambulatory Visit | Attending: Pulmonary Disease | Admitting: Pulmonary Disease

## 2018-12-18 ENCOUNTER — Other Ambulatory Visit: Payer: Self-pay

## 2018-12-18 DIAGNOSIS — Z1231 Encounter for screening mammogram for malignant neoplasm of breast: Secondary | ICD-10-CM | POA: Diagnosis not present

## 2018-12-21 LAB — HM MAMMOGRAPHY

## 2019-01-10 DIAGNOSIS — R895 Abnormal microbiological findings in specimens from other organs, systems and tissues: Secondary | ICD-10-CM

## 2019-01-10 DIAGNOSIS — N3010 Interstitial cystitis (chronic) without hematuria: Secondary | ICD-10-CM

## 2019-01-10 DIAGNOSIS — M810 Age-related osteoporosis without current pathological fracture: Secondary | ICD-10-CM

## 2019-01-10 DIAGNOSIS — M329 Systemic lupus erythematosus, unspecified: Secondary | ICD-10-CM

## 2019-01-25 MED FILL — HYDROXYCHLOROQUINE SULFATE: 200 | 90 days supply | Qty: 135 | Fill #3

## 2019-02-08 DIAGNOSIS — K121 Other forms of stomatitis: Secondary | ICD-10-CM | POA: Diagnosis not present

## 2019-02-08 DIAGNOSIS — L932 Other local lupus erythematosus: Secondary | ICD-10-CM | POA: Diagnosis not present

## 2019-02-08 DIAGNOSIS — Z79899 Other long term (current) drug therapy: Secondary | ICD-10-CM | POA: Diagnosis not present

## 2019-02-08 DIAGNOSIS — M7072 Other bursitis of hip, left hip: Secondary | ICD-10-CM | POA: Diagnosis not present

## 2019-02-08 DIAGNOSIS — M810 Age-related osteoporosis without current pathological fracture: Secondary | ICD-10-CM | POA: Diagnosis not present

## 2019-02-09 DIAGNOSIS — Z79899 Other long term (current) drug therapy: Secondary | ICD-10-CM | POA: Diagnosis not present

## 2019-02-09 DIAGNOSIS — L932 Other local lupus erythematosus: Secondary | ICD-10-CM | POA: Diagnosis not present

## 2019-02-25 MED FILL — PROLIA 60 MG/ML SOLN: 60 | 180 days supply | Qty: 1 | Fill #0 | Status: TO

## 2019-02-26 ENCOUNTER — Ambulatory Visit (HOSPITAL_BASED_OUTPATIENT_CLINIC_OR_DEPARTMENT_OTHER): Payer: 59 | Admitting: Pharmacist

## 2019-02-26 ENCOUNTER — Other Ambulatory Visit: Payer: Self-pay

## 2019-02-26 DIAGNOSIS — Z7189 Other specified counseling: Secondary | ICD-10-CM

## 2019-02-26 MED ORDER — PROLIA 60 MG/ML ~~LOC~~ SOSY: 60.0000 mg | PREFILLED_SYRINGE | SUBCUTANEOUS | 0 refills | Status: DC

## 2019-02-26 MED FILL — PROLIA 60 MG/ML SOLN: 60 | 180 days supply | Qty: 1 | Fill #0

## 2019-02-26 NOTE — Progress Notes (Signed)
S:  Patient presents for review of their specialty medication therapy.  Patient is currently taking Prolia for low bone density. Patient is managed by Dr. Dossie Der for this.   Adherence: confirms; has been on medication for ~1 year  Efficacy: reports that Dr. Dossie Der is pleased with results  Dosing: 60 mg q7months  Dose adjustments: Renal: Monitor patients with severe impairment (CrCl <30 mL/minute or on dialysis) closely, as significant and prolonged hypocalcemia (incidence of 29% and potentially lasting weeks to months) and marked elevations of serum parathyroid hormone are serious risks in this population. Ensure adequate calcium and vitamin D intake/supplementation. CrCl ?30 mL/minute: No dosage adjustment necessary. CrCl <30 mL/minute: No dosage adjustment necessary; use in conjunction with guidance from patient's nephrology team. Hepatic: no dose adjustments (has not been studied)  Monitoring: S/sx of infection: none  S/sx of hypersensitivity: none S/sx of hypocalcemia/hypercalcemia: none  Hip, thigh or groin pain: none Jaw pain/changes to oral health: none  Side effects: Dermatitis/skin rash: none Peripheral edema: none HA: none GI upset: none  hip, thigh, or groin pain. Additionally, patients should monitor for and report jaw pain, tooth/periodontal infection, toothache, and/or gingival ulceration/erosion  Other side effects: none reported   O:   Lab Results  Component Value Date   WBC 3.4 (L) 10/14/2018   HGB 13.6 10/14/2018   HCT 41.7 10/14/2018   MCV 97.7 10/14/2018   PLT 190 10/14/2018      Chemistry      Component Value Date/Time   NA 139 10/14/2018 0711   K 4.4 10/14/2018 0711   CL 110 10/14/2018 0711   CO2 29 10/14/2018 0711   BUN 14 10/14/2018 0711   CREATININE 0.68 10/14/2018 0711      Component Value Date/Time   CALCIUM 9.1 10/14/2018 0711   ALKPHOS 37 (L) 10/14/2018 0711   AST 24 10/14/2018 0711   ALT 23 10/14/2018 0711   BILITOT 0.7 10/14/2018  0711      A/P: 1. Medication review: Patient currently on Prolia for osteoporosis. Reviewed the medication with the patient, including the following: Prolia (denosumab) is a monoclonal antibody with affinity for nuclear factor-kappa ligand (RANKL). Prolia binds to RANKL and prevents osteoclast formation, leading to decreased bone resorption and increased bone mass in osteoporosis. Patient educated on purpose, proper use, and potential adverse effects of Prolia. The most common adverse effects are hypersensitivities, peripheral edema, dermatitis/skin rash, GI upset, HA, joint pain, and infection. There is the possibility of atypical femur fracture, serum calcium disturbances, and osteonecrosis of the jaw. Prolia exists as a solution prefilled syringe for SQ administration. Administration: SubQ route only and should not be administered IV, IM, or intradermally. Prior to administration, bring to room temperature in original container (allow to stand ~15 to 30 minutes); do not warm by any other method. Solution may contain trace amounts of translucent to white protein particles; do not use if cloudy, discolored (normal solution should be clear and colorless to pale yellow), or contains excessive particles or foreign matter. Avoid vigorous shaking. Administer via SubQ injection in the upper arm, upper thigh, or abdomen; should only be administered by a health care professional. No recommendations for any changes at this time.  Benard Halsted, PharmD, [place] [phone]

## 2019-03-02 DIAGNOSIS — M810 Age-related osteoporosis without current pathological fracture: Secondary | ICD-10-CM | POA: Diagnosis not present

## 2019-04-20 MED FILL — HYDROXYCHLOROQUINE SULFATE: 200 | 90 days supply | Qty: 135 | Fill #0

## 2019-05-26 ENCOUNTER — Ambulatory Visit (INDEPENDENT_AMBULATORY_CARE_PROVIDER_SITE_OTHER): Payer: 59 | Admitting: Internal Medicine

## 2019-05-26 ENCOUNTER — Other Ambulatory Visit: Payer: Self-pay

## 2019-05-26 ENCOUNTER — Encounter (INDEPENDENT_AMBULATORY_CARE_PROVIDER_SITE_OTHER): Payer: Self-pay | Admitting: Internal Medicine

## 2019-05-26 VITALS — BP 122/64 | HR 81 | Temp 98.4°F | Resp 18 | Ht 62.0 in | Wt 110.2 lb

## 2019-05-26 DIAGNOSIS — M329 Systemic lupus erythematosus, unspecified: Secondary | ICD-10-CM

## 2019-05-26 DIAGNOSIS — M810 Age-related osteoporosis without current pathological fracture: Secondary | ICD-10-CM | POA: Diagnosis not present

## 2019-05-26 DIAGNOSIS — R5381 Other malaise: Secondary | ICD-10-CM | POA: Diagnosis not present

## 2019-05-26 DIAGNOSIS — R5383 Other fatigue: Secondary | ICD-10-CM

## 2019-05-26 DIAGNOSIS — E559 Vitamin D deficiency, unspecified: Secondary | ICD-10-CM

## 2019-05-26 NOTE — Progress Notes (Signed)
Metrics: Intervention Frequency ACO  Documented Smoking Status Yearly  Screened one or more times in 24 months  Cessation Counseling or  Active cessation medication Past 24 months  Past 24 months   Guideline developer: UpToDate (See UpToDate for funding source) Date Released: 2014       Wellness Office Visit  Subjective:  Patient ID: Kara Bradley, female    DOB: 1965-05-30  Age: 54 y.o. MRN: [id number]  CC: This 54 year old lady comes to our practice as a new patient to be established. HPI  She has a history of osteoporosis and sees a rheumatologist and takes Prolia injections every 6 months. She also has SLE which thankfully has only affected her skin.  She takes medication for this also. She describes intermittent fatigue. Past Medical History:  Diagnosis Date  . Abnormal microbiological findings in specimens from other organs, systems and tissues   . Broken arm    left arm / pt states was walking her dogs and tripped on rock   . Chronic kidney disease    pt states had renal ultasound last week/pt states has renal stone bilat but states MD is not concerned at this time   . GERD (gastroesophageal reflux disease)   . Hemorrhoids   . Interstitial cystitis   . Interstitial cystitis (chronic) without hematuria   . Lupus ([place])   . Microhematuria   . Nocturia   . Osteopenia   . Osteoporosis   . Pituitary cyst ([place])   . PONV (postoperative nausea and vomiting)   . Systemic lupus erythematosus, unspecified ([place])   . Urinary frequency       Family History  Problem Relation Age of Onset  . Diabetes Mother   . Hypertension Mother   . Arthritis Brother   . Alcohol abuse Brother   . Cancer Paternal Aunt        breast; anal  . Heart disease Maternal Grandmother   . COPD Maternal Grandfather   . Emphysema Maternal Grandfather   . Alzheimer's disease Paternal Grandmother   . Colon cancer Paternal Grandmother   . Macular degeneration Paternal Grandfather   . Other  Paternal Grandfather        polymyositis rheumatica  . Cancer Other        colon  . Colon polyps Neg Hx     Social History   Social History Narrative   MARRIED SINCE 1994.WORKS IN THE LAB at [employer]    Social History   Tobacco Use  . Smoking status: Never Smoker  . Smokeless tobacco: Never Used  Substance Use Topics  . Alcohol use: Yes    Comment: rarely    Current Meds  Medication Sig  . ALOE VERA PO Take 1 capsule by mouth every evening.  . Calcium Carb-Cholecalciferol (CALCIUM+D3 PO) Take 1 tablet by mouth 2 (two) times daily.  Marland Kitchen CLARITIN REDITABS 5 MG TBDP Take 5 mg by mouth at bedtime.  . clobetasol cream (TEMOVATE) [id number] % Apply 1 application topically at bedtime.   Marland Kitchen denosumab (PROLIA) 60 MG/ML SOSY injection Inject 60 mg into the skin every 6 (six) months.  . fluticasone (CUTIVATE) 0.005 % ointment Apply 1 application topically at bedtime.   . hydroxychloroquine (PLAQUENIL) 200 MG tablet Take 300 mg by mouth daily.   . Multiple Vitamins-Minerals (MULTIVITAMINS THER. W/MINERALS) TABS tablet Take 1 tablet by mouth daily.   Marland Kitchen triamcinolone (NASACORT) 55 MCG/ACT AERO nasal inhaler Place 1 spray into the nose daily as needed (for allergies.).   . [  DISCONTINUED] acetaminophen (TYLENOL) 325 MG tablet Take 325 mg by mouth every 6 (six) hours as needed (pain.).  . [DISCONTINUED] ibuprofen (ADVIL,MOTRIN) 200 MG tablet Take 200 mg by mouth every 8 (eight) hours as needed for mild pain (pain.).       Objective:   Today's Vitals: BP 122/64 (BP Location: Right Arm, Patient Position: Sitting, Cuff Size: Normal)   Pulse 81   Temp 98.4 F (36.9 C) (Oral)   Resp 18   Ht 5\' 2"  (1.575 m)   Wt 110 lb 3.2 oz (50 kg)   SpO2 98%   BMI 20.16 kg/m  Vitals with BMI 05/26/2019 02/06/2018 02/06/2018  Height 5\' 2"  - -  Weight 110 lbs 3 oz - -  BMI [id number] - -  Systolic 123XX123 [id number]  Diastolic 64 56 54  Pulse 81 94 116     Physical Exam   She looks systemically well.  She is alert and  orientated.  Blood pressure is excellent.    Assessment   1. Malaise and fatigue   2. Vitamin D deficiency disease   3. Osteoporosis, unspecified osteoporosis type, unspecified pathological fracture presence   4. SLE (systemic lupus erythematosus related syndrome) ([place])       Tests ordered Orders Placed This Encounter  Procedures  . CBC  . COMPLETE METABOLIC PANEL WITH GFR  . T3, free  . T4  . TSH  . VITAMIN D 25 Hydroxy (Vit-D Deficiency, Fractures)     Plan: 1. Blood work is ordered above. 2. She will continue with all medications for chronic conditions above. 3. I discussed the philosophy of practice based on nutrition, exercise and diet and hormone optimization. 4. I will see them next few weeks to discuss all results and further recommendations going forward. 5. Today I spent 30 minutes with this patient reviewing her medical record and discussing her overall health and medications.   No orders of the defined types were placed in this encounter.   Doree Albee, MD

## 2019-05-27 DIAGNOSIS — Z1283 Encounter for screening for malignant neoplasm of skin: Secondary | ICD-10-CM | POA: Diagnosis not present

## 2019-05-27 DIAGNOSIS — L821 Other seborrheic keratosis: Secondary | ICD-10-CM | POA: Diagnosis not present

## 2019-05-27 DIAGNOSIS — L930 Discoid lupus erythematosus: Secondary | ICD-10-CM | POA: Diagnosis not present

## 2019-05-27 LAB — CBC
HCT: 39.9 % (ref 35.0–45.0)
Hemoglobin: 13.3 g/dL (ref 11.7–15.5)
MCH: 31.5 pg (ref 27.0–33.0)
MCHC: 33.3 g/dL (ref 32.0–36.0)
MCV: 94.5 fL (ref 80.0–100.0)
MPV: 12.1 fL (ref 7.5–12.5)
Platelets: 202 10*3/uL (ref 140–400)
RBC: 4.22 10*6/uL (ref 3.80–5.10)
RDW: 11.9 % (ref 11.0–15.0)
WBC: 5 10*3/uL (ref 3.8–10.8)

## 2019-05-27 LAB — COMPLETE METABOLIC PANEL WITH GFR
AG Ratio: 1.5 (calc) (ref 1.0–2.5)
ALT: 20 U/L (ref 6–29)
AST: 22 U/L (ref 10–35)
Albumin: 4.1 g/dL (ref 3.6–5.1)
Alkaline phosphatase (APISO): 49 U/L (ref 37–153)
BUN: 21 mg/dL (ref 7–25)
CO2: 30 mmol/L (ref 20–32)
Calcium: 9.6 mg/dL (ref 8.6–10.4)
Chloride: 104 mmol/L (ref 98–110)
Creat: 0.71 mg/dL (ref 0.50–1.05)
GFR, Est African American: 114 mL/min/{1.73_m2} (ref >=60)
GFR, Est Non African American: 99 mL/min/{1.73_m2} (ref >=60)
Globulin: 2.8 g/dL (calc) (ref 1.9–3.7)
Glucose, Bld: 112 mg/dL — ABNORMAL HIGH (ref 65–99)
Potassium: 4.4 mmol/L (ref 3.5–5.3)
Sodium: 141 mmol/L (ref 135–146)
Total Bilirubin: 0.3 mg/dL (ref 0.2–1.2)
Total Protein: 6.9 g/dL (ref 6.1–8.1)

## 2019-05-27 LAB — VITAMIN D 25 HYDROXY (VIT D DEFICIENCY, FRACTURES): Vit D, 25-Hydroxy: 43 ng/mL (ref 30–100)

## 2019-05-27 LAB — T3, FREE: T3, Free: 2.9 pg/mL (ref 2.3–4.2)

## 2019-05-27 LAB — T4: T4, Total: 7.5 ug/dL (ref 5.1–11.9)

## 2019-05-27 LAB — TSH: TSH: 3.66 mIU/L

## 2019-07-13 ENCOUNTER — Other Ambulatory Visit: Payer: Self-pay

## 2019-07-13 ENCOUNTER — Encounter (INDEPENDENT_AMBULATORY_CARE_PROVIDER_SITE_OTHER): Payer: Self-pay | Admitting: Internal Medicine

## 2019-07-13 ENCOUNTER — Ambulatory Visit (INDEPENDENT_AMBULATORY_CARE_PROVIDER_SITE_OTHER): Payer: 59 | Admitting: Internal Medicine

## 2019-07-13 VITALS — BP 119/75 | HR 73 | Temp 98.1°F | Ht 62.0 in | Wt 109.6 lb

## 2019-07-13 DIAGNOSIS — M329 Systemic lupus erythematosus, unspecified: Secondary | ICD-10-CM | POA: Diagnosis not present

## 2019-07-13 DIAGNOSIS — E559 Vitamin D deficiency, unspecified: Secondary | ICD-10-CM
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
## 2019-08-09 DIAGNOSIS — K121 Other forms of stomatitis: Secondary | ICD-10-CM | POA: Diagnosis not present

## 2019-08-09 DIAGNOSIS — M7072 Other bursitis of hip, left hip: Secondary | ICD-10-CM | POA: Diagnosis not present

## 2019-08-09 DIAGNOSIS — Z79899 Other long term (current) drug therapy: Secondary | ICD-10-CM | POA: Diagnosis not present

## 2019-08-09 DIAGNOSIS — M810 Age-related osteoporosis without current pathological fracture: Secondary | ICD-10-CM | POA: Diagnosis not present

## 2019-08-09 DIAGNOSIS — L932 Other local lupus erythematosus: Secondary | ICD-10-CM | POA: Diagnosis not present

## 2019-08-09 MED FILL — PROLIA 60 MG/ML SOLN: 60 | 180 days supply | Qty: 1 | Fill #0

## 2019-08-17 ENCOUNTER — Other Ambulatory Visit: Payer: Self-pay

## 2019-08-17 ENCOUNTER — Ambulatory Visit (INDEPENDENT_AMBULATORY_CARE_PROVIDER_SITE_OTHER): Payer: 59 | Admitting: Internal Medicine

## 2019-08-17 ENCOUNTER — Encounter (INDEPENDENT_AMBULATORY_CARE_PROVIDER_SITE_OTHER): Payer: Self-pay | Admitting: Internal Medicine

## 2019-08-17 VITALS — BP 110/70 | HR 72 | Temp 97.7°F | Resp 17 | Ht 62.0 in | Wt 110.8 lb

## 2019-08-17 DIAGNOSIS — M810 Age-related osteoporosis without current pathological fracture: Secondary | ICD-10-CM

## 2019-08-17 DIAGNOSIS — E559 Vitamin D deficiency, unspecified: Secondary | ICD-10-CM
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
## 2019-08-18 LAB — COMPLETE METABOLIC PANEL WITH GFR
AG Ratio: 1.5 (calc) (ref 1.0–2.5)
ALT: 19 U/L (ref 6–29)
AST: 19 U/L (ref 10–35)
Albumin: 4.3 g/dL (ref 3.6–5.1)
Alkaline phosphatase (APISO): 42 U/L (ref 37–153)
BUN: 16 mg/dL (ref 7–25)
CO2: 29 mmol/L (ref 20–32)
Calcium: 10 mg/dL (ref 8.6–10.4)
Chloride: 104 mmol/L (ref 98–110)
Creat: 0.75 mg/dL (ref 0.50–1.05)
GFR, Est African American: 107 mL/min/{1.73_m2} (ref >=60)
GFR, Est Non African American: 92 mL/min/{1.73_m2} (ref >=60)
Globulin: 2.8 g/dL (calc) (ref 1.9–3.7)
Glucose, Bld: 69 mg/dL (ref 65–99)
Potassium: 4.3 mmol/L (ref 3.5–5.3)
Sodium: 140 mmol/L (ref 135–146)
Total Bilirubin: 0.4 mg/dL (ref 0.2–1.2)
Total Protein: 7.1 g/dL (ref 6.1–8.1)

## 2019-08-18 LAB — VITAMIN D 25 HYDROXY (VIT D DEFICIENCY, FRACTURES): Vit D, 25-Hydroxy: 66 ng/mL (ref 30–100)

## 2019-08-19 ENCOUNTER — Other Ambulatory Visit: Payer: Self-pay | Admitting: Pharmacist

## 2019-08-19 MED ORDER — PROLIA 60 MG/ML ~~LOC~~ SOSY: 60.0000 mg | PREFILLED_SYRINGE | SUBCUTANEOUS | 1 refills | Status: DC

## 2019-09-01 MED FILL — PROLIA 60 MG/ML SOLN: 60 | 180 days supply | Qty: 1 | Fill #0

## 2019-09-07 DIAGNOSIS — M810 Age-related osteoporosis without current pathological fracture: Secondary | ICD-10-CM | POA: Diagnosis not present

## 2019-10-20 MED FILL — HYDROXYCHLOROQUINE SULFATE: 200 | 90 days supply | Qty: 135 | Fill #0

## 2019-11-15 DIAGNOSIS — Z79899 Other long term (current) drug therapy: Secondary | ICD-10-CM | POA: Diagnosis not present

## 2019-11-15 DIAGNOSIS — H524 Presbyopia: Secondary | ICD-10-CM | POA: Diagnosis not present

## 2019-11-15 DIAGNOSIS — H5213 Myopia, bilateral: Secondary | ICD-10-CM | POA: Diagnosis not present

## 2019-11-15 DIAGNOSIS — H52203 Unspecified astigmatism, bilateral: Secondary | ICD-10-CM | POA: Diagnosis not present

## 2019-11-15 DIAGNOSIS — M3210 Systemic lupus erythematosus, organ or system involvement unspecified: Secondary | ICD-10-CM | POA: Diagnosis not present

## 2019-11-18 ENCOUNTER — Encounter (INDEPENDENT_AMBULATORY_CARE_PROVIDER_SITE_OTHER): Payer: 59 | Admitting: Internal Medicine

## 2019-11-23 ENCOUNTER — Other Ambulatory Visit: Payer: Self-pay

## 2019-11-23 ENCOUNTER — Ambulatory Visit (INDEPENDENT_AMBULATORY_CARE_PROVIDER_SITE_OTHER): Payer: 59 | Admitting: Nurse Practitioner

## 2019-11-23 ENCOUNTER — Encounter (INDEPENDENT_AMBULATORY_CARE_PROVIDER_SITE_OTHER): Payer: Self-pay | Admitting: Nurse Practitioner

## 2019-11-23 VITALS — BP 110/66 | HR 89 | Temp 97.8°F | Resp 18 | Ht 62.0 in | Wt 107.0 lb

## 2019-11-23 DIAGNOSIS — N3001 Acute cystitis with hematuria: Secondary | ICD-10-CM

## 2019-11-23 MED ORDER — NITROFURANTOIN MONOHYD MACRO 100 MG PO CAPS: 100.0000 mg | ORAL_CAPSULE | Freq: Two times a day (BID) | ORAL | 0 refills | Status: DC

## 2019-11-23 NOTE — Progress Notes (Signed)
Subjective:  Patient ID: Kara Bradley, female    DOB: 06/11/1965  Age: 54 y.o. MRN: [id number]  CC:  Chief Complaint  Patient presents with   Urinary Tract Infection    started 2AM been up all nighr. No relief yet.       HPI  This patient arrives today for the above.  This patient arrives today for acute visit for dysuria.  She tells me her symptoms after started about 1 month ago.  She does have a history of interstitial cystitis and certain foods will sometimes cause urinary discomfort.  She was traveling at the beach last month and thinks that this may have contributed to some of the symptoms, however when she returned home and the symptoms persisted she started to become concerned.  Around 2 AM this morning the pain was so severe that it woke her up out of bed.  She continues to have frequency and burning with urination.  She denies any visible hematuria, fevers, nausea, or vomiting.  She has picked up some Pyridium over-the-counter and started taking that this morning for symptom management.  Past Medical History:  Diagnosis Date   Abnormal microbiological findings in specimens from other organs, systems and tissues    Broken arm    left arm / pt states was walking her dogs and tripped on rock    Chronic kidney disease    pt states had renal ultasound last week/pt states has renal stone bilat but states MD is not concerned at this time    GERD (gastroesophageal reflux disease)    Hemorrhoids    Interstitial cystitis    Interstitial cystitis (chronic) without hematuria    Lupus ([facility])    Microhematuria    Nocturia    Osteopenia    Osteoporosis    Pituitary cyst ([facility])    PONV (postoperative nausea and vomiting)    Systemic lupus erythematosus, unspecified ([place])    Urinary frequency       Family History  Problem Relation Age of Onset   Diabetes Mother    Hypertension Mother    Arthritis Brother    Alcohol abuse Brother    Cancer  Paternal Aunt        breast; anal   Heart disease Maternal Grandmother    COPD Maternal Grandfather    Emphysema Maternal Grandfather    Alzheimer's disease Paternal Grandmother    Colon cancer Paternal Grandmother    Macular degeneration Paternal Grandfather    Other Paternal Grandfather        polymyositis rheumatica   Cancer Other        colon   Colon polyps Neg Hx     Social History   Social History Narrative   MARRIED 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   Calcium Carb-Cholecalciferol (CALCIUM+D3 PO) Take 1 tablet by mouth 2 (two) times daily.   Calcium Carbonate-Vit D-Min (CALTRATE 600+D PLUS PO) Take 600 mg by mouth in the morning and at bedtime.   Cholecalciferol (VITAMIN D3) 50 MCG (2000 UT) TABS Take 2 tablets by mouth daily.   CLARITIN REDITABS 5 MG TBDP Take 5 mg by mouth at bedtime.  clobetasol cream (TEMOVATE) 6.43 % Apply 1 application topically at bedtime.    denosumab (PROLIA) 60 MG/ML SOSY injection Inject 60 mg into the skin every 6 (six) months.   fluticasone (CUTIVATE) 0.005 % ointment Apply 1 application topically at bedtime.    hydroxychloroquine (PLAQUENIL) 200 MG tablet Take 300 mg by mouth daily.    Multiple Vitamins-Minerals (MULTIVITAMINS THER. W/MINERALS) TABS tablet Take 1 tablet by mouth daily.    triamcinolone (NASACORT) 55 MCG/ACT AERO nasal inhaler Place 1 spray into the nose daily as needed (for allergies.).     ROS:  Review of Systems  Constitutional: Negative for fever.  Gastrointestinal: Negative for nausea and vomiting.  Genitourinary: Positive for dysuria, frequency and urgency. Negative for flank pain and hematuria.     Objective:   Today's Vitals: BP 110/66 (BP  Location: Right Arm, Patient Position: Sitting, Cuff Size: Normal)    Pulse 89    Temp 97.8 F (36.6 C) (Temporal)    Resp 18    Ht 5\' 2"  (1.575 m)    Wt 107 lb (48.5 kg)    SpO2 99%    BMI 19.57 kg/m  Vitals with BMI 11/23/2019 08/17/2019 07/13/2019  Height 5\' 2"  5\' 2"  5\' 2"   Weight 107 lbs 110 lbs 13 oz 109 lbs 10 oz  BMI 19.57 32.95 18.84  Systolic 166 063 016  Diastolic 66 70 75  Pulse 89 72 73     Physical Exam Vitals reviewed.  Constitutional:      General: She is not in acute distress.    Appearance: Normal appearance.  HENT:     Head: Normocephalic and atraumatic.  Neck:     Vascular: No carotid bruit.  Cardiovascular:     Rate and Rhythm: Normal rate and regular rhythm.     Pulses: Normal pulses.     Heart sounds: Normal heart sounds.  Pulmonary:     Effort: Pulmonary effort is normal.     Breath sounds: Normal breath sounds.  Abdominal:     Tenderness: There is no right CVA tenderness or left CVA tenderness.  Skin:    General: Skin is warm and dry.  Neurological:     General: No focal deficit present.     Mental Status: She is alert and oriented to person, place, and time.  Psychiatric:        Mood and Affect: Mood normal.        Behavior: Behavior normal.        Judgment: Judgment normal.       POC U/A: Positive for leukocytes, negative for nitrates, positive for urobilinogen, positive for protein, pH 6.5, positive for microscopic blood, specific gravity of 1.0, mildly positive ketones, positive for bilirubin, positive for glucose, yellow in color   Assessment and Plan   1. Acute cystitis with hematuria      Plan: 1.  She has probable urinary tract infection and cystitis.  We will send the urine off to be cultured.  Based on her symptoms and physical exam I do not think that it has progressed to pyelonephritis this time.  She tells me she has been on ciprofloxacin in the past but she did experience some joint pain while she was on it, based on the black  box warning for tendon rupture will try to avoid ciprofloxacin and will instead prescribe course of Macrobid.  She was told if she has any negative side effects she can call let us know.  She tells me she understands.  She is also encouraged to continue taking her Pyridium over-the-counter for symptom management.  She was told to call us if she experiences fever, nausea, vomiting, visible blood in her urine.  She tells me she understands.  She is already scheduled follow-up next week, and I encouraged her to follow-up at that time.   Tests ordered Orders Placed This Encounter  Procedures   Urinalysis with Culture Reflex      Meds ordered this encounter  Medications   nitrofurantoin, macrocrystal-monohydrate, (MACROBID) 100 MG capsule    Sig: Take 1 capsule (100 mg total) by mouth 2 (two) times daily.    Dispense:  10 capsule    Refill:  0    Order Specific Question:   Supervising Provider    Answer:   Doree Albee [[id number]]    Patient to follow-up in 1 week as scheduled.  Ailene Ards, NP

## 2019-11-26 LAB — URINE CULTURE

## 2019-11-26 LAB — URINALYSIS W MICROSCOPIC + REFLEX CULTURE
Bilirubin Urine: NEGATIVE
Glucose, UA: NEGATIVE
Hyaline Cast: NONE SEEN /LPF
Ketones, ur: NEGATIVE
Nitrites, Initial: POSITIVE — AB
Protein, ur: NEGATIVE
Specific Gravity, Urine: 1.006 (ref 1.001–1.03)
Squamous Epithelial / HPF: NONE SEEN /HPF (ref <=5)
pH: 7 (ref 5.0–8.0)

## 2019-11-26 LAB — CULTURE INDICATED

## 2019-11-30 ENCOUNTER — Other Ambulatory Visit: Payer: Self-pay

## 2019-11-30 ENCOUNTER — Encounter (INDEPENDENT_AMBULATORY_CARE_PROVIDER_SITE_OTHER): Payer: Self-pay | Admitting: Internal Medicine

## 2019-11-30 ENCOUNTER — Ambulatory Visit (INDEPENDENT_AMBULATORY_CARE_PROVIDER_SITE_OTHER): Payer: 59 | Admitting: Internal Medicine

## 2019-11-30 VITALS — BP 112/72 | HR 74 | Temp 97.8°F | Ht 61.0 in | Wt 105.8 lb

## 2019-11-30 DIAGNOSIS — M329 Systemic lupus erythematosus, unspecified: Secondary | ICD-10-CM | POA: Diagnosis not present

## 2019-11-30 DIAGNOSIS — M810 Age-related osteoporosis without current pathological fracture: Secondary | ICD-10-CM | POA: Diagnosis not present

## 2019-11-30 DIAGNOSIS — N3001 Acute cystitis with hematuria: Secondary | ICD-10-CM | POA: Diagnosis not present

## 2019-11-30 DIAGNOSIS — Z13220 Encounter for screening for lipoid disorders: Secondary | ICD-10-CM

## 2019-11-30 DIAGNOSIS — Z0001 Encounter for general adult medical examination with abnormal findings: Secondary | ICD-10-CM

## 2019-11-30 DIAGNOSIS — Z23 Encounter for immunization: Secondary | ICD-10-CM | POA: Diagnosis not present
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
## 2019-11-30 NOTE — Addendum Note (Signed)
Addended by: Anibal Henderson on: 11/30/2019 03:38 PM   Modules accepted: Orders

## 2019-12-02 LAB — URINALYSIS W MICROSCOPIC + REFLEX CULTURE
Bacteria, UA: NONE SEEN /HPF
Bilirubin Urine: NEGATIVE
Glucose, UA: NEGATIVE
Hgb urine dipstick: NEGATIVE
Hyaline Cast: NONE SEEN /LPF
Ketones, ur: NEGATIVE
Nitrites, Initial: NEGATIVE
Protein, ur: NEGATIVE
Specific Gravity, Urine: 1.012 (ref 1.001–1.03)
Squamous Epithelial / HPF: NONE SEEN /HPF (ref <=5)
pH: 6 (ref 5.0–8.0)

## 2019-12-02 LAB — CBC
HCT: 40 % (ref 35.0–45.0)
Hemoglobin: 13.8 g/dL (ref 11.7–15.5)
MCH: 32.3 pg (ref 27.0–33.0)
MCHC: 34.5 g/dL (ref 32.0–36.0)
MCV: 93.7 fL (ref 80.0–100.0)
MPV: 12.3 fL (ref 7.5–12.5)
Platelets: 217 10*3/uL (ref 140–400)
RBC: 4.27 10*6/uL (ref 3.80–5.10)
RDW: 11.8 % (ref 11.0–15.0)
WBC: 5.3 10*3/uL (ref 3.8–10.8)

## 2019-12-02 LAB — URINE CULTURE

## 2019-12-02 LAB — LIPID PANEL
Cholesterol: 171 mg/dL (ref <200)
HDL: 82 mg/dL (ref >=50)
LDL Cholesterol (Calc): 75 mg/dL (calc)
Non-HDL Cholesterol (Calc): 89 mg/dL (calc) (ref <130)
Total CHOL/HDL Ratio: 2.1 (calc) (ref <5.0)
Triglycerides: 63 mg/dL (ref <150)

## 2019-12-02 LAB — CULTURE INDICATED

## 2019-12-23 ENCOUNTER — Other Ambulatory Visit (HOSPITAL_COMMUNITY): Payer: Self-pay | Admitting: Internal Medicine

## 2019-12-23 DIAGNOSIS — Z1231 Encounter for screening mammogram for malignant neoplasm of breast: Secondary | ICD-10-CM

## 2020-01-04 IMAGING — MG DIGITAL SCREENING BILAT W/ TOMO W/ CAD
6 of 10 series · 6 of 30 positions shown · non-contrast
Comparison: Previous exam(s).

CLINICAL DATA: Screening.

EXAM:
DIGITAL SCREENING BILATERAL MAMMOGRAM WITH TOMO AND CAD

[R MLO synth-2D (1 of 2)]
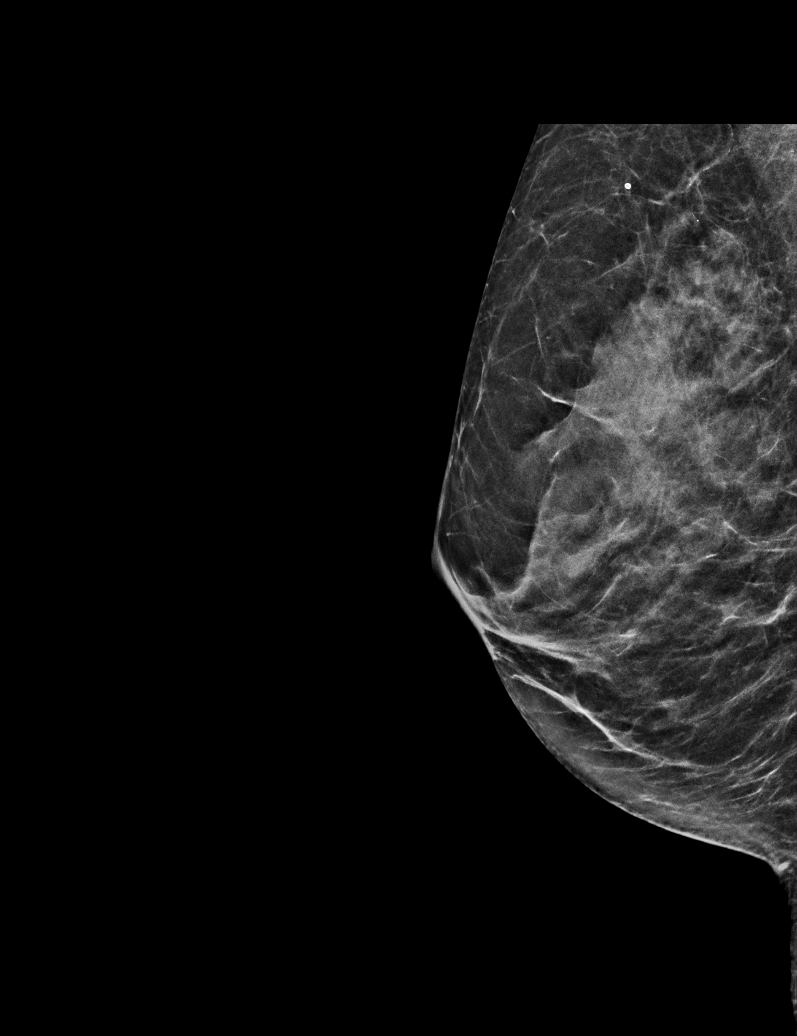

[R CC synth-2D]
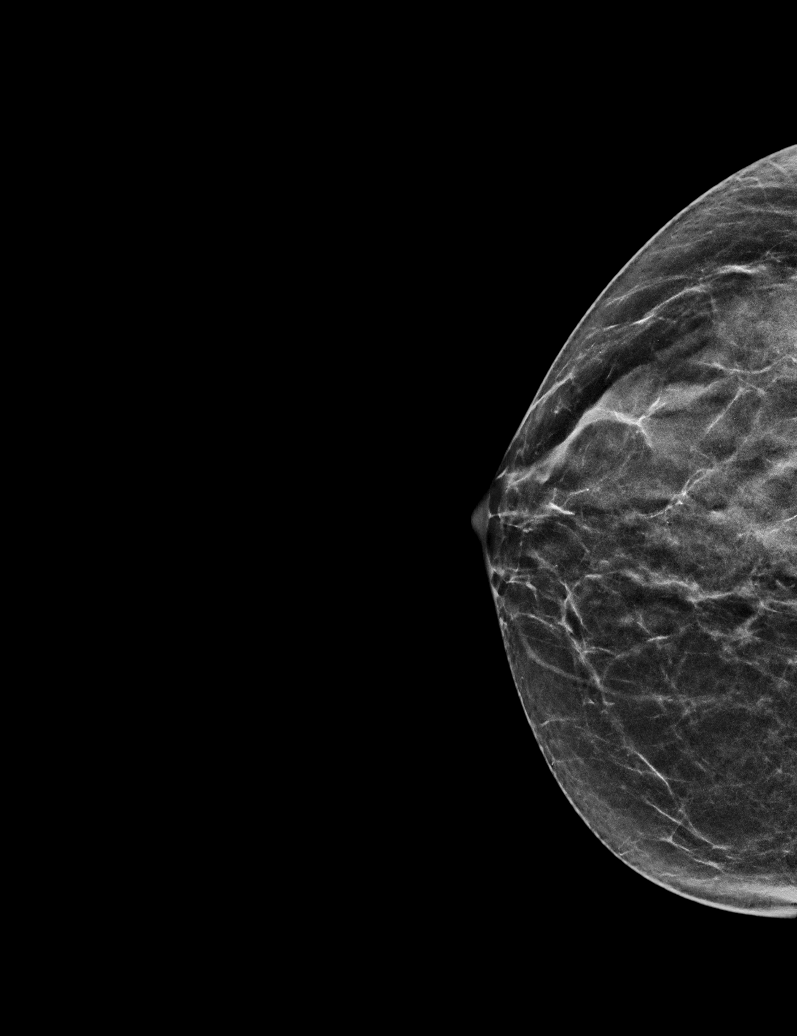

[L MLO synth-2D]
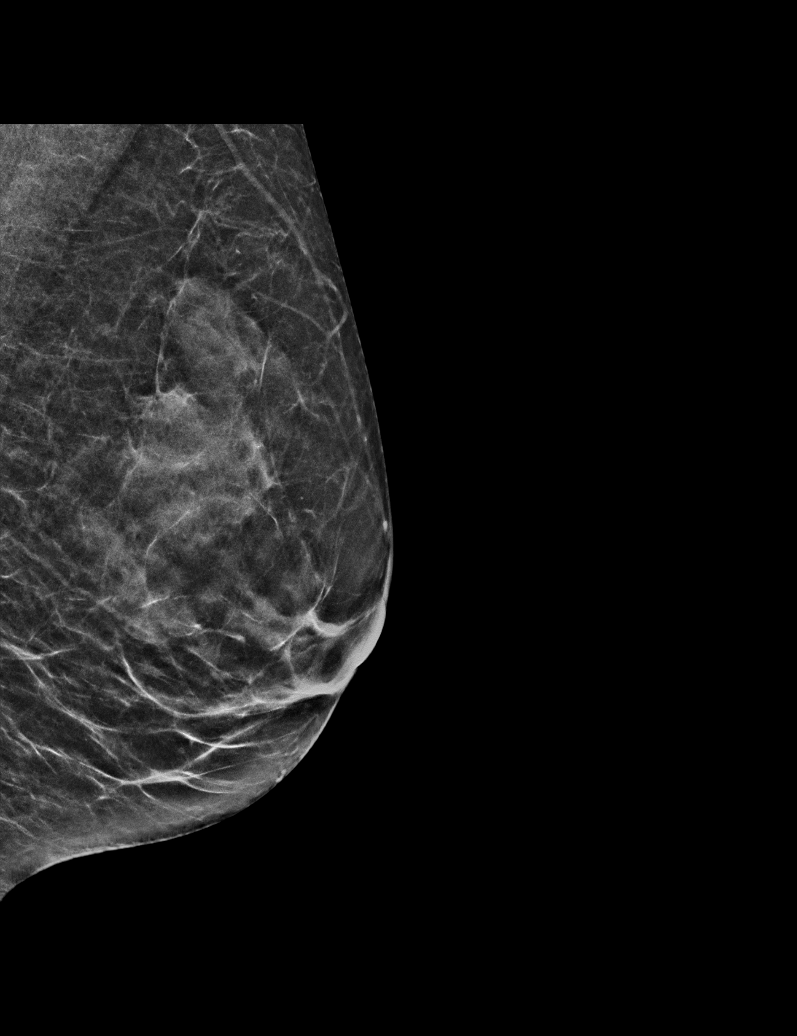

[R MLO synth-2D (2 of 2)]
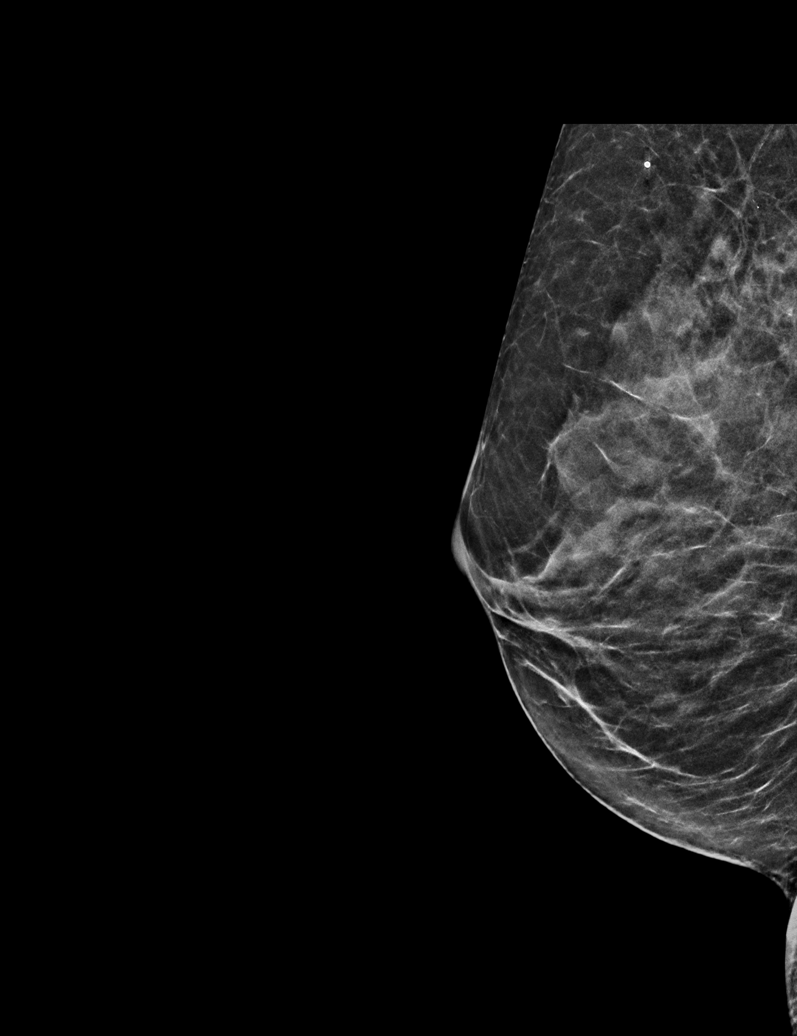

[L CC synth-2D]
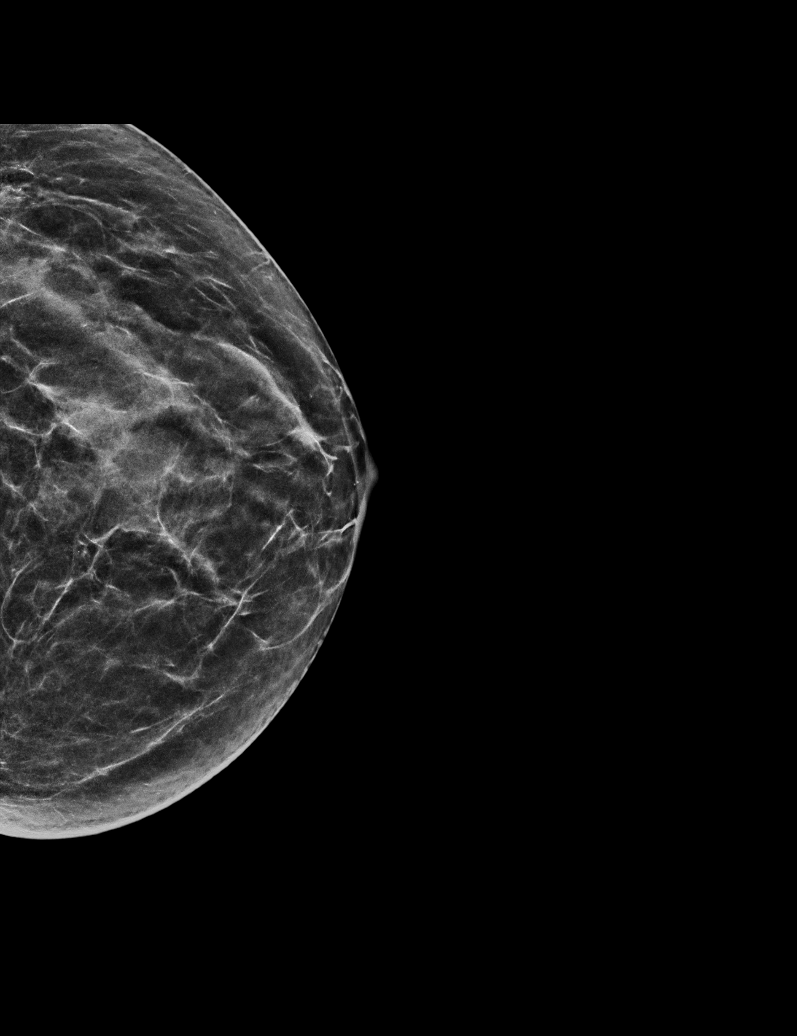

[L MLO tomo · tomo slice 23/46.0]
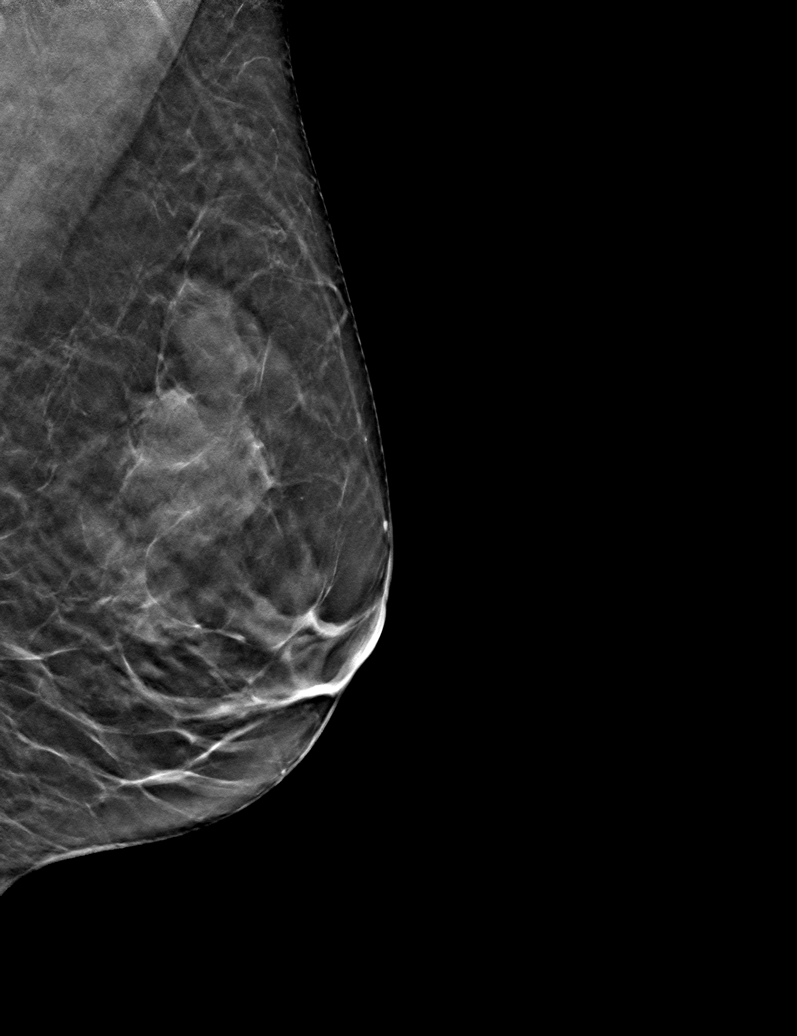

[6 of 30 positions shown; findings below may reference images not displayed]

ACR Breast Density Category c: The breast tissue is heterogeneously
dense, which may obscure small masses.
FINDINGS: There are no findings suspicious for malignancy. Images were
processed with CAD.
IMPRESSION: No mammographic evidence of malignancy. A result letter of this
screening mammogram will be mailed directly to the patient.

RECOMMENDATION:
Screening mammogram in one year. (Code:FT-U-LHB)

BI-RADS CATEGORY  1: Negative.

## 2020-01-17 ENCOUNTER — Other Ambulatory Visit (HOSPITAL_COMMUNITY): Payer: Self-pay | Admitting: Rheumatology

## 2020-01-18 MED FILL — HYDROXYCHLOROQUINE SULFATE: 200 | 90 days supply | Qty: 135 | Fill #0

## 2020-02-01 ENCOUNTER — Encounter (INDEPENDENT_AMBULATORY_CARE_PROVIDER_SITE_OTHER): Payer: Self-pay | Admitting: Nurse Practitioner

## 2020-02-01 ENCOUNTER — Other Ambulatory Visit: Payer: Self-pay

## 2020-02-01 ENCOUNTER — Ambulatory Visit (INDEPENDENT_AMBULATORY_CARE_PROVIDER_SITE_OTHER): Payer: 59 | Admitting: Nurse Practitioner

## 2020-02-01 VITALS — BP 96/60 | HR 73 | Temp 97.7°F | Ht 61.0 in | Wt 106.8 lb

## 2020-02-01 DIAGNOSIS — N3090 Cystitis, unspecified without hematuria: Secondary | ICD-10-CM | POA: Diagnosis not present

## 2020-02-01 LAB — POCT URINALYSIS DIPSTICK
Bilirubin, UA: NEGATIVE
Blood, UA: POSITIVE
Glucose, UA: NEGATIVE
Ketones, UA: NEGATIVE
Nitrite, UA: NEGATIVE
Protein, UA: POSITIVE — AB
Spec Grav, UA: 1.02 (ref 1.010–1.025)
Urobilinogen, UA: 0.2 E.U./dL
pH, UA: 7.5 (ref 5.0–8.0)

## 2020-02-01 MED ORDER — NITROFURANTOIN MONOHYD MACRO 100 MG PO CAPS: 100.0000 mg | ORAL_CAPSULE | Freq: Two times a day (BID) | ORAL | 0 refills | Status: DC

## 2020-02-01 MED ORDER — FLUCONAZOLE 150 MG PO TABS: 150.0000 mg | ORAL_TABLET | Freq: Once | ORAL | 0 refills | Status: AC

## 2020-02-01 NOTE — Addendum Note (Signed)
Addended by: Jeralyn Ruths E on: 02/01/2020 11:30 AM   Modules accepted: Orders

## 2020-02-01 NOTE — Progress Notes (Addendum)
Subjective:  Patient ID: Kara Bradley, female    DOB: 07-Oct-1965  Age: 54 y.o. MRN: [id number]  CC:  Chief Complaint  Patient presents with  . Urinary Frequency    Painful urination, started yesterday morning, pressure      HPI  This patient arrives today for the above.  She tells me that yesterday she started experiencing some urinary frequency, dysuria, and abdominal pressure.  She also mentions some mild nausea.  She believes she might have a urinary tract infection.  She tells me about 2 months ago she had urinary tract infection at that time as well.  Per chart review I do see that she was seen on October 12 for similar symptoms urine culture at that time grew E.Coli.  She does have a history of interstitial cystitis.  She tells me last time she saw urologist was in 2020.  She does mention that she thinks that sexual activity may be a trigger for her current symptoms.  Past Medical History:  Diagnosis Date  . Abnormal microbiological findings in specimens from other organs, systems and tissues   . Broken arm    left arm / pt states was walking her dogs and tripped on rock   . Chronic kidney disease    pt states had renal ultasound last week/pt states has renal stone bilat but states MD is not concerned at this time   . GERD (gastroesophageal reflux disease)   . Hemorrhoids   . Interstitial cystitis   . Interstitial cystitis (chronic) without hematuria   . Lupus ([place])   . Microhematuria   . Nocturia   . Osteopenia   . Osteoporosis   . Pituitary cyst ([place])   . PONV (postoperative nausea and vomiting)   . Systemic lupus erythematosus, unspecified ([place])   . Urinary frequency       Family History  Problem Relation Age of Onset  . Diabetes Mother   . Hypertension Mother   . Arthritis Father   . Arthritis Brother   . Alcohol abuse Brother   . Cancer Paternal Aunt        breast; anal  . Heart disease Maternal Grandmother   . COPD Maternal Grandfather   .  Emphysema Maternal Grandfather   . Alzheimer's disease Paternal Grandmother   . Colon cancer Paternal Grandmother   . Macular degeneration Paternal Grandfather   . Other Paternal Grandfather        polymyositis rheumatica  . Cancer Other        colon  . 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   Social History   Tobacco Use  . Smoking status: Never Smoker  . Smokeless tobacco: Never Used  Substance Use Topics  . Alcohol use: Yes    Comment: rarely     Current Meds  Medication Sig  . acetaminophen (TYLENOL) 500 MG tablet as needed.  . ALOE VERA PO Take 1 capsule by mouth every evening.  . Calcium Carb-Cholecalciferol (CALCIUM+D3 PO) Take 1 tablet by mouth 2 (two) times daily.  . calcium carbonate (TUMS - DOSED IN MG ELEMENTAL CALCIUM) 500 MG chewable tablet 1 tablet  . Calcium Carbonate-Vit D-Min (CALTRATE 600+D PLUS PO) Take 600 mg by mouth in the morning and at bedtime.  . Cholecalciferol (VITAMIN D3) 50 MCG (2000 UT) TABS  Take 2 tablets by mouth daily.  Marland Kitchen CLARITIN REDITABS 5 MG TBDP Take 5 mg by mouth at bedtime.  . clobetasol cream (TEMOVATE) 9.62 % Apply 1 application topically at bedtime.   Marland Kitchen denosumab (PROLIA) 60 MG/ML SOSY injection Inject 60 mg into the skin every 6 (six) months.  . fluticasone (CUTIVATE) 0.005 % ointment Apply 1 application topically at bedtime.   . hydroxychloroquine (PLAQUENIL) 200 MG tablet Take 300 mg by mouth daily.   Marland Kitchen ibuprofen (ADVIL) 200 MG tablet as needed.  . Multiple Vitamins-Minerals (MULTIVITAMINS THER. W/MINERALS) TABS tablet Take 1 tablet by mouth daily.   Marland Kitchen triamcinolone (NASACORT) 55 MCG/ACT AERO nasal inhaler Place 1 spray into the nose daily as needed (for allergies.).     ROS:  Review of Systems  Constitutional: Negative for chills and fever.  Gastrointestinal: Positive for  abdominal pain and nausea. Negative for diarrhea and vomiting.  Genitourinary: Negative for hematuria.     Objective:   Today's Vitals: BP 96/60   Pulse 73   Temp 97.7 F (36.5 C) (Temporal)   Ht 5\' 1"  (1.549 m)   Wt 106 lb 12.8 oz (48.4 kg)   SpO2 99%   BMI 20.18 kg/m  Vitals with BMI 02/01/2020 11/30/2019 11/23/2019  Height 5\' 1"  5\' 1"  5\' 2"   Weight 106 lbs 13 oz 105 lbs 13 oz 107 lbs  BMI 83.66 20 29.47  Systolic 96 654 650  Diastolic 60 72 66  Pulse 73 74 89     Physical Exam Vitals reviewed.  Constitutional:      General: She is not in acute distress.    Appearance: Normal appearance.  HENT:     Head: Normocephalic and atraumatic.  Neck:     Vascular: No carotid bruit.  Cardiovascular:     Rate and Rhythm: Normal rate and regular rhythm.     Pulses: Normal pulses.     Heart sounds: Normal heart sounds.  Pulmonary:     Effort: Pulmonary effort is normal.     Breath sounds: Normal breath sounds.  Abdominal:     Tenderness: There is no right CVA tenderness or left CVA tenderness.  Skin:    General: Skin is warm and dry.  Neurological:     General: No focal deficit present.     Mental Status: She is alert and oriented to person, place, and time.  Psychiatric:        Mood and Affect: Mood normal.        Behavior: Behavior normal.        Judgment: Judgment normal.          Assessment and Plan   1. Cystitis      Plan: 1.  Point-of-care urine dipstick does show signs of urinary tract infection.  Will treat patient with course of Macrobid and 1 dose of Diflucan as needed for vaginal yeast infection if this occurs while taking the Macrobid.  She was told that if she experiences any worsening nausea, vomiting, diarrhea, visible hematuria, or fever that she should consider proceeding to the emergency department.  She tells me she understands.  We did discuss that if she experiences 1 additional urinary tract infection within the next calendar year we may  need to consider referring back to urology for evaluation management of her symptoms.  She tells me she understands.   Tests ordered Orders Placed This Encounter  Procedures  . POCT urinalysis dipstick      Meds ordered this encounter  Medications  .  nitrofurantoin, macrocrystal-monohydrate, (MACROBID) 100 MG capsule    Sig: Take 1 capsule (100 mg total) by mouth 2 (two) times daily.    Dispense:  14 capsule    Refill:  0    Order Specific Question:   Supervising Provider    Answer:   Hurshel Party C [[id number]]  . fluconazole (DIFLUCAN) 150 MG tablet    Sig: Take 1 tablet (150 mg total) by mouth once for 1 dose.    Dispense:  1 tablet    Refill:  0    Order Specific Question:   Supervising Provider    Answer:   Doree Albee [[id number]]    Patient to follow-up as scheduled or sooner as needed.  Ailene Ards, NP

## 2020-02-02 DIAGNOSIS — L932 Other local lupus erythematosus: Secondary | ICD-10-CM | POA: Diagnosis not present

## 2020-02-02 DIAGNOSIS — M8589 Other specified disorders of bone density and structure, multiple sites: Secondary | ICD-10-CM | POA: Diagnosis not present

## 2020-02-02 DIAGNOSIS — Z79899 Other long term (current) drug therapy: Secondary | ICD-10-CM | POA: Diagnosis not present

## 2020-02-02 DIAGNOSIS — K121 Other forms of stomatitis: Secondary | ICD-10-CM | POA: Diagnosis not present

## 2020-02-02 DIAGNOSIS — M810 Age-related osteoporosis without current pathological fracture: Secondary | ICD-10-CM | POA: Diagnosis not present

## 2020-02-03 ENCOUNTER — Other Ambulatory Visit: Payer: Self-pay

## 2020-02-03 ENCOUNTER — Ambulatory Visit (HOSPITAL_COMMUNITY)
Admission: RE | Admit: 2020-02-03 | Discharge: 2020-02-03 | Disposition: A | Discharge status: Home / Self Care | Payer: 59 | Source: Ambulatory Visit | Attending: Internal Medicine | Admitting: Internal Medicine

## 2020-02-03 DIAGNOSIS — Z1231 Encounter for screening mammogram for malignant neoplasm of breast: Secondary | ICD-10-CM | POA: Diagnosis not present

## 2020-02-03 LAB — URINALYSIS W MICROSCOPIC + REFLEX CULTURE
Bilirubin Urine: NEGATIVE
Glucose, UA: NEGATIVE
Hgb urine dipstick: NEGATIVE
Hyaline Cast: NONE SEEN /LPF
Ketones, ur: NEGATIVE
Nitrites, Initial: NEGATIVE
Specific Gravity, Urine: 1.015 (ref 1.001–1.03)
pH: 7.5 (ref 5.0–8.0)

## 2020-02-03 LAB — URINE CULTURE

## 2020-02-03 LAB — CULTURE INDICATED

## 2020-02-23 ENCOUNTER — Other Ambulatory Visit: Payer: Self-pay

## 2020-02-23 ENCOUNTER — Ambulatory Visit (HOSPITAL_BASED_OUTPATIENT_CLINIC_OR_DEPARTMENT_OTHER): Payer: 59 | Admitting: Pharmacist

## 2020-02-23 DIAGNOSIS — Z7189 Other specified counseling: Secondary | ICD-10-CM

## 2020-02-23 NOTE — Progress Notes (Signed)
S:  Patient presents for review of their specialty medication therapy.  Patient is currently taking Prolia for low bone density. Patient is managed by Dr. Dossie Der for this.   Adherence: confirms; has been on medication for ~2 years  Efficacy: reports that Dr. Dossie Der is pleased with results. Had a density scan 02/02/20 with noted improvement.   Dosing: 60 mg q43months  Dose adjustments: Renal: Monitor patients with severe impairment (CrCl <30 mL/minute or on dialysis) closely, as significant and prolonged hypocalcemia (incidence of 29% and potentially lasting weeks to months) and marked elevations of serum parathyroid hormone are serious risks in this population. Ensure adequate calcium and vitamin D intake/supplementation. CrCl ?30 mL/minute: No dosage adjustment necessary. CrCl <30 mL/minute: No dosage adjustment necessary; use in conjunction with guidance from patient's nephrology team. Hepatic: no dose adjustments (has not been studied)  Monitoring: S/sx of infection: none  S/sx of hypersensitivity: none S/sx of hypocalcemia/hypercalcemia: none  Hip, thigh or groin pain: none Jaw pain/changes to oral health: no jaw pain; pt does note she had a chipped molar that was managed (crown placement) by her dentist this year.  Side effects: Dermatitis/skin rash: none Peripheral edema: none HA: none GI upset: none  Other side effects: none reported   O:   Lab Results  Component Value Date   WBC 5.3 11/30/2019   HGB 13.8 11/30/2019   HCT 40.0 11/30/2019   MCV 93.7 11/30/2019   PLT 217 11/30/2019      Chemistry      Component Value Date/Time   NA 140 08/17/2019 1546   K 4.3 08/17/2019 1546   CL 104 08/17/2019 1546   CO2 29 08/17/2019 1546   BUN 16 08/17/2019 1546   CREATININE 0.75 08/17/2019 1546      Component Value Date/Time   CALCIUM 10.0 08/17/2019 1546   ALKPHOS 37 (L) 10/14/2018 0711   AST 19 08/17/2019 1546   ALT 19 08/17/2019 1546   BILITOT 0.4 08/17/2019 1546       A/P: 1. Medication review: Patient currently on Prolia for osteoporosis. Reviewed the medication with the patient, including the following: Prolia (denosumab) is a monoclonal antibody with affinity for nuclear factor-kappa ligand (RANKL). Prolia binds to RANKL and prevents osteoclast formation, leading to decreased bone resorption and increased bone mass in osteoporosis. Patient educated on purpose, proper use, and potential adverse effects of Prolia. The most common adverse effects are hypersensitivities, peripheral edema, dermatitis/skin rash, GI upset, HA, joint pain, and infection. There is the possibility of atypical femur fracture, serum calcium disturbances, and osteonecrosis of the jaw. Prolia exists as a solution prefilled syringe for SQ administration. Administration: SubQ route only and should not be administered IV, IM, or intradermally. Prior to administration, bring to room temperature in original container (allow to stand ~15 to 30 minutes); do not warm by any other method. Solution may contain trace amounts of translucent to white protein particles; do not use if cloudy, discolored (normal solution should be clear and colorless to pale yellow), or contains excessive particles or foreign matter. Avoid vigorous shaking. Administer via SubQ injection in the upper arm, upper thigh, or abdomen; should only be administered by a health care professional. Pt reports her specialist plans to complete 5 years of therapy. No recommendations for any changes at this time.  Benard Halsted, PharmD, [place], [place] [phone]

## 2020-03-21 MED FILL — PROLIA 60 MG/ML SOLN: 60 | 180 days supply | Qty: 1 | Fill #1

## 2020-03-22 DIAGNOSIS — M810 Age-related osteoporosis without current pathological fracture: Secondary | ICD-10-CM | POA: Diagnosis not present

## 2020-04-18 ENCOUNTER — Other Ambulatory Visit (HOSPITAL_COMMUNITY): Payer: Self-pay | Admitting: Rheumatology

## 2020-04-18 MED FILL — HYDROXYCHLOROQUINE SULFATE: 200 | 90 days supply | Qty: 135 | Fill #0

## 2020-05-13 ENCOUNTER — Other Ambulatory Visit (HOSPITAL_COMMUNITY): Payer: Self-pay

## 2020-05-23 ENCOUNTER — Other Ambulatory Visit (HOSPITAL_COMMUNITY): Payer: Self-pay

## 2020-05-31 ENCOUNTER — Ambulatory Visit (INDEPENDENT_AMBULATORY_CARE_PROVIDER_SITE_OTHER): Payer: 59 | Admitting: Internal Medicine

## 2020-05-31 ENCOUNTER — Other Ambulatory Visit: Payer: Self-pay

## 2020-05-31 ENCOUNTER — Encounter (INDEPENDENT_AMBULATORY_CARE_PROVIDER_SITE_OTHER): Payer: Self-pay | Admitting: Internal Medicine

## 2020-05-31 VITALS — BP 128/66 | HR 73 | Temp 97.3°F | Resp 17 | Ht 61.0 in | Wt 106.0 lb

## 2020-05-31 DIAGNOSIS — M810 Age-related osteoporosis without current pathological fracture: Secondary | ICD-10-CM | POA: Diagnosis not present

## 2020-05-31 DIAGNOSIS — N3000 Acute cystitis without hematuria: Secondary | ICD-10-CM

## 2020-05-31 DIAGNOSIS — M329 Systemic lupus erythematosus, unspecified: Secondary | ICD-10-CM | POA: Diagnosis not present

## 2020-05-31 MED ORDER — ESTRADIOL 0.1 MG/GM VA CREA: TOPICAL_CREAM | VAGINAL | 12 refills | Status: DC
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
## 2020-06-15 ENCOUNTER — Other Ambulatory Visit (HOSPITAL_COMMUNITY): Payer: Self-pay

## 2020-06-15 DIAGNOSIS — D225 Melanocytic nevi of trunk: Secondary | ICD-10-CM | POA: Diagnosis not present

## 2020-06-15 DIAGNOSIS — B078 Other viral warts: Secondary | ICD-10-CM | POA: Diagnosis not present

## 2020-06-15 DIAGNOSIS — L930 Discoid lupus erythematosus: Secondary | ICD-10-CM | POA: Diagnosis not present

## 2020-06-15 DIAGNOSIS — Z1283 Encounter for screening for malignant neoplasm of skin: Secondary | ICD-10-CM | POA: Diagnosis not present

## 2020-06-16 ENCOUNTER — Other Ambulatory Visit (HOSPITAL_COMMUNITY): Payer: Self-pay

## 2020-06-16 MED ORDER — CLOBETASOL PROPIONATE 0.05 % EX SOLN
1.0000 "application " | Freq: Two times a day (BID) | CUTANEOUS | 3 refills | Status: DC | PRN
  Filled 2020-06-16: qty 25, 13d supply, fill #0

## 2020-06-21 ENCOUNTER — Other Ambulatory Visit (HOSPITAL_COMMUNITY): Payer: Self-pay

## 2020-07-12 ENCOUNTER — Other Ambulatory Visit (HOSPITAL_COMMUNITY): Payer: Self-pay

## 2020-07-13 ENCOUNTER — Encounter (INDEPENDENT_AMBULATORY_CARE_PROVIDER_SITE_OTHER): Payer: Self-pay | Admitting: Internal Medicine

## 2020-07-13 ENCOUNTER — Ambulatory Visit (INDEPENDENT_AMBULATORY_CARE_PROVIDER_SITE_OTHER): Payer: 59 | Admitting: Internal Medicine

## 2020-07-13 ENCOUNTER — Other Ambulatory Visit: Payer: Self-pay

## 2020-07-13 VITALS — BP 118/77 | HR 79 | Temp 97.8°F | Resp 18 | Ht 61.0 in | Wt 106.8 lb

## 2020-07-13 DIAGNOSIS — N898 Other specified noninflammatory disorders of vagina: Secondary | ICD-10-CM | POA: Diagnosis not present
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
## 2020-07-17 ENCOUNTER — Other Ambulatory Visit (HOSPITAL_COMMUNITY): Payer: Self-pay

## 2020-07-17 ENCOUNTER — Other Ambulatory Visit: Payer: Self-pay

## 2020-07-17 MED ORDER — HYDROXYCHLOROQUINE SULFATE 200 MG PO TABS
300.0000 mg | ORAL_TABLET | Freq: Every day | ORAL | 1 refills | Status: DC
  Filled 2020-07-17: qty 135, 90d supply, fill #0
  Filled 2020-11-20: qty 135, 90d supply, fill #1

## 2020-07-18 ENCOUNTER — Other Ambulatory Visit (HOSPITAL_COMMUNITY): Payer: Self-pay

## 2020-08-02 DIAGNOSIS — L932 Other local lupus erythematosus: Secondary | ICD-10-CM | POA: Diagnosis not present

## 2020-08-02 DIAGNOSIS — M810 Age-related osteoporosis without current pathological fracture: Secondary | ICD-10-CM | POA: Diagnosis not present

## 2020-08-02 DIAGNOSIS — K121 Other forms of stomatitis: Secondary | ICD-10-CM | POA: Diagnosis not present

## 2020-08-02 DIAGNOSIS — Z79899 Other long term (current) drug therapy: Secondary | ICD-10-CM | POA: Diagnosis not present

## 2020-09-08 ENCOUNTER — Other Ambulatory Visit (HOSPITAL_COMMUNITY): Payer: Self-pay

## 2020-09-11 ENCOUNTER — Other Ambulatory Visit (HOSPITAL_COMMUNITY): Payer: Self-pay

## 2020-09-12 ENCOUNTER — Other Ambulatory Visit (HOSPITAL_COMMUNITY): Payer: Self-pay

## 2020-09-12 ENCOUNTER — Other Ambulatory Visit: Payer: Self-pay | Admitting: Pharmacist

## 2020-09-12 MED ORDER — PROLIA 60 MG/ML ~~LOC~~ SOSY: PREFILLED_SYRINGE | SUBCUTANEOUS | 1 refills | Status: DC

## 2020-09-12 MED ORDER — PROLIA 60 MG/ML ~~LOC~~ SOSY
PREFILLED_SYRINGE | SUBCUTANEOUS | 1 refills | Status: DC
  Filled 2020-09-12: qty 1, 180d supply, fill #0
  Filled 2021-03-09: qty 1, 180d supply, fill #1

## 2020-09-21 ENCOUNTER — Other Ambulatory Visit (HOSPITAL_COMMUNITY): Payer: Self-pay

## 2020-09-21 DIAGNOSIS — M810 Age-related osteoporosis without current pathological fracture: Secondary | ICD-10-CM | POA: Diagnosis not present

## 2020-09-28 ENCOUNTER — Telehealth: Payer: 59 | Admitting: Physician Assistant

## 2020-09-28 DIAGNOSIS — U071 COVID-19: Secondary | ICD-10-CM

## 2020-09-28 MED ORDER — MOLNUPIRAVIR EUA 200MG CAPSULE
4.0000 | ORAL_CAPSULE | Freq: Two times a day (BID) | ORAL | 0 refills | Status: AC
  Filled 2020-09-28: qty 40, 5d supply, fill #0

## 2020-09-28 MED ORDER — BENZONATATE 100 MG PO CAPS
100.0000 mg | ORAL_CAPSULE | Freq: Three times a day (TID) | ORAL | 0 refills | Status: DC | PRN
  Filled 2020-09-28: qty 30, 10d supply, fill #0

## 2020-09-28 NOTE — Patient Instructions (Signed)
Kara Bradley, thank you for joining Mar Daring, PA-C for today's virtual visit.  While this provider is not your primary care provider (PCP), if your PCP is located in our provider database this encounter information will be shared with them immediately following your visit.  Consent: (Patient) Kara Bradley provided verbal consent for this virtual visit at the beginning of the encounter.  Hello Kara Bradley,  You are being placed in the home monitoring program for COVID-19 (commonly known as Coronavirus).  This is because you are suspected to have the virus or are known to have the virus.  If you are unsure which group you fall into call your clinic.    As part of this program, you'll answer a daily questionnaire in the MyChart mobile app. You'll receive a notification through the MyChart app when the questionnaire is available. When you log in to MyChart, you'll see the tasks in your To Do activity.       Clinicians will see any answers that are concerning and take appropriate steps.  If at any point you are having a medical emergency, call 911.  If otherwise concerned call your clinic instead of coming into the clinic or hospital.  To keep from spreading the disease you should: Stay home and limit contact with other people as much as possible.  Wash your hands frequently. Cover your coughs and sneezes with a tissue, and throw used tissues in the trash.   Clean and disinfect frequently touched surfaces and objects.    Take care of yourself by: Staying home Resting Drinking fluids Take fever-reducing medications (Tylenol/Acetaminophen and Ibuprofen)  For more information on the disease go to the Centers for Disease Control and Prevention website     COVID-19: What to Do if You Are Sick CDC has updated isolation and quarantine recommendations for the public, and is revising the CDC website to reflect these changes. These recommendations do not apply to healthcare  personnel and do not supersede state, local, tribal, or territorial laws, rules, andregulations. If you have a fever, cough or other symptoms, you might have COVID-19. Most people have mild illness and are able to recover at home. If you are sick: Keep track of your symptoms. If you have an emergency warning sign (including trouble breathing), call 911. Steps to help prevent the spread of COVID-19 if you are sick If you are sick with COVID-19 or think you might have COVID-19, follow the steps below to care for yourself and to help protect other peoplein your home and community. Stay home except to get medical care Stay home. Most people with COVID-19 have mild illness and can recover at home without medical care. Do not leave your home, except to get medical care. Do not visit public areas. Take care of yourself. Get rest and stay hydrated. Take over-the-counter medicines, such as acetaminophen, to help you feel better. Stay in touch with your doctor. Call before you get medical care. Be sure to get care if you have trouble breathing, or have any other emergency warning signs, or if you think it is an emergency. Avoid public transportation, ride-sharing, or taxis. Separate yourself from other people As much as possible, stay in a specific room and away from other people and pets in your home. If possible, you should use a separate bathroom. If you need to be around other people or animals in oroutside of the home, wear a mask. Tell your close contactsthat they may have been exposed to COVID-19. An  infected person can spread COVID-19 starting 48 hours (or 2 days) before the person has any symptoms or tests positive. By letting your close contacts know they may have been exposed to COVID-19, you are helping to protect everyone. Additional guidance is available for those living in close quarters and shared housing. See COVID-19 and Animals if you have questions about pets. If you are diagnosed with  COVID-19, someone from the health department may call you. Answer the call to slow the spread. Monitor your symptoms Symptoms of COVID-19 include fever, cough, or other symptoms. Follow care instructions from your healthcare provider and local health department. Your local health authorities may give instructions on checking your symptoms and reporting information. When to seek emergency medical attention Look for emergency warning signs* for COVID-19. If someone is showing any of these signs, seek emergency medical care immediately: Trouble breathing Persistent pain or pressure in the chest New confusion Inability to wake or stay awake Pale, gray, or blue-colored skin, lips, or nail beds, depending on skin tone *This list is not all possible symptoms. Please call your medical provider forany other symptoms that are severe or concerning to you. Call 911 or call ahead to your local emergency facility: Notify the operator that you are seeking care for someone who has or may haveCOVID-19. Call ahead before visiting your doctor Call ahead. Many medical visits for routine care are being postponed or done by phone or telemedicine. If you have a medical appointment that cannot be postponed, call your doctor's office, and tell them you have or may have COVID-19. This will help the office protect themselves and other patients. Get tested If you have symptoms of COVID-19, get tested. While waiting for test results, you stay away from others, including staying apart from those living in your household. Self-tests are one of several options for testing for the virus that causes COVID-19 and may be more convenient than laboratory-based tests and point-of-care tests. Ask your healthcare provider or your local health department if you need help interpreting your test results. You can visit your state, tribal, local, and territorial health department's website to look for the latest local information on testing  sites. If you are sick, wear a mask over your nose and mouth You should wear a mask over your nose and mouth if you must be around other people or animals, including pets (even at home). You don't need to wear the mask if you are alone. If you can't put on a mask (because of trouble breathing, for example), cover your coughs and sneezes in some other way. Try to stay at least 6 feet away from other people. This will help protect the people around you. Masks should not be placed on young children under age 71 years, anyone who has trouble breathing, or anyone who is not able to remove the mask without help. Note: During the COVID-19 pandemic, medical grade facemasks are reserved forhealthcare workers and some first responders. Cover your coughs and sneezes Cover your mouth and nose with a tissue when you cough or sneeze. Throw away used tissues in a lined trash can. Immediately wash your hands with soap and water for at least 20 seconds. If soap and water are not available, clean your hands with an alcohol-based hand sanitizer that contains at least 60% alcohol. Clean your hands often Wash your hands often with soap and water for at least 20 seconds. This is especially important after blowing your nose, coughing, or sneezing; going to the bathroom;  and before eating or preparing food. Use hand sanitizer if soap and water are not available. Use an alcohol-based hand sanitizer with at least 60% alcohol, covering all surfaces of your hands and rubbing them together until they feel dry. Soap and water are the best option, especially if hands are visibly dirty. Avoid touching your eyes, nose, and mouth with unwashed hands. Handwashing Tips Avoid sharing personal household items Do not share dishes, drinking glasses, cups, eating utensils, towels, or bedding with other people in your home. Wash these items thoroughly after using them with soap and water or put in the dishwasher. Clean all "high-touch"  surfaces every day Clean and disinfect high-touch surfaces in your "sick room" and bathroom; wear disposable gloves. Let someone else clean and disinfect surfaces in common areas, but you should clean your bedroom and bathroom, if possible. If a caregiver or other person needs to clean and disinfect a sick person's bedroom or bathroom, they should do so on an as-needed basis. The caregiver/other person should wear a mask and disposable gloves prior to cleaning. They should wait as long as possible after the person who is sick has used the bathroom before coming in to clean and use the bathroom. High-touch surfaces include phones, remote controls, counters, tabletops, doorknobs, bathroom fixtures, toilets, keyboards, tablets, and bedside tables. Clean and disinfect areas that may have blood, stool, or body fluids on them. Use household cleaners and disinfectants. Clean the area or item with soap and water or another detergent if it is dirty. Then, use a household disinfectant. Be sure to follow the instructions on the label to ensure safe and effective use of the product. Many products recommend keeping the surface wet for several minutes to ensure germs are killed. Many also recommend precautions such as wearing gloves and making sure you have good ventilation during use of the product. Use a product from [employer] List [employer]: Disinfectants for Coronavirus ([id number]). Complete Disinfection Guidance When you can be around others after being sick with COVID-19 Deciding when you can be around others is different for different situations. Find out when you can safely end home isolation. For any additional questions about your care,contact your healthcare provider or state or local health department. 01/19/2020 Content source: [facility] ([facility]), Division of Viral Diseases This information is not intended to replace advice given to you by your health care provider.  Make sure you discuss any questions you have with your healthcare provider. Document Revised: 03/17/2020 Document Reviewed: 03/17/2020 Elsevier Patient Education  2022 [place] are being prescribed MOLNUPIRAVIR for COVID-19 infection.   Please call the pharmacy or go through the drive through vs going inside if you are picking up the mediation yourself to prevent further spread. If prescribed to a [facility] affiliated pharmacy, a pharmacist will bring the medication out to your car.   ADMINISTRATION INSTRUCTIONS: Take with or without food. Swallow the tablets whole. Don't chew, crush, or break the medications because it might not work as well  For each dose of the medication, you should be taking FOUR tablets at one time, TWICE a day   Finish your full five-day course of Molnupiravir even if you feel better before you're done. Stopping this medication too early can make it less effective to prevent severe illness related to [place].    Molnupiravir is prescribed for YOU ONLY. Don't share it with others, even if they have similar symptoms as you. This medication might not be  right for everyone.   Make sure to take steps to protect yourself and others while you're taking this medication in order to get well soon and to prevent others from getting sick with COVID-19.   **If you are of childbearing potential (any gender) - it is advised to not get pregnant while taking this medication and recommended that condoms are used for female partners the next 3 months after taking the medication out of extreme caution    COMMON SIDE EFFECTS: Diarrhea Nausea  Dizziness    If your COVID-19 symptoms get worse, get medical help right away. Call 911 if you experience symptoms such as worsening cough, trouble breathing, chest pain that doesn't go away, confusion, a hard time staying awake, and pale or blue-colored skin. This medication won't prevent all COVID-19 cases from getting worse.     Can take to lessen severity: Vit C '500mg'$  twice daily Quercertin 250-'500mg'$  twice daily Zinc 75-'100mg'$  daily Melatonin 3-6 mg at bedtime Vit D3 1000-2000 IU daily Aspirin 81 mg daily with food Optional: Famotidine '20mg'$  daily Also can add tylenol/ibuprofen as needed for fevers and body aches May add Mucinex or Mucinex DM as needed for cough/congestion'   10 Things You Can Do to Manage Your COVID-19 Symptoms at Home If you have possible or confirmed COVID-19 Stay home except to get medical care. Monitor your symptoms carefully. If your symptoms get worse, call your healthcare provider immediately. Get rest and stay hydrated. If you have a medical appointment, call the healthcare provider ahead of time and tell them that you have or may have COVID-19. For medical emergencies, call 911 and notify the dispatch personnel that you have or may have COVID-19. Cover your cough and sneezes with a tissue or use the inside of your elbow. Wash your hands often with soap and water for at least 20 seconds or clean your hands with an alcohol-based hand sanitizer that contains at least 60% alcohol. As much as possible, stay in a specific room and away from other people in your home. Also, you should use a separate bathroom, if available. If you need to be around other people in or outside of the home, wear a mask. Avoid sharing personal items with other people in your household, like dishes, towels, and bedding. Clean all surfaces that are touched often, like counters, tabletops, and doorknobs. Use household cleaning sprays or wipes according to the label instructions. 10-14-1978 08/27/2019 This information is not intended to replace advice given to you by your health care provider. Make sure you discuss any questions you have with your healthcare provider. Document Revised: 03/17/2020 Document Reviewed: 03/17/2020 Elsevier Patient Education  [place].

## 2020-09-28 NOTE — Progress Notes (Signed)
Virtual Visit Consent   Kara Bradley, you are scheduled for a virtual visit with a [facility] provider today.     Just as with appointments in the office, your consent must be obtained to participate.  Your consent will be active for this visit and any virtual visit you may have with one of our providers in the next 365 days.     If you have a MyChart account, a copy of this consent can be sent to you electronically.  All virtual visits are billed to your insurance company just like a traditional visit in the office.    As this is a virtual visit, video technology does not allow for your provider to perform a traditional examination.  This may limit your provider's ability to fully assess your condition.  If your provider identifies any concerns that need to be evaluated in person or the need to arrange testing (such as labs, EKG, etc.), we will make arrangements to do so.     Although advances in technology are sophisticated, we cannot ensure that it will always work on either your end or our end.  If the connection with a video visit is poor, the visit may have to be switched to a telephone visit.  With either a video or telephone visit, we are not always able to ensure that we have a secure connection.     I need to obtain your verbal consent now.   Are you willing to proceed with your visit today?    Kara Bradley has provided verbal consent on 09/28/2020 for a virtual visit (video or telephone).   Mar Daring, PA-C   Date: 09/28/2020 8:01 PM   Virtual Visit via Video Note   I, Mar Daring, connected with  Kara Bradley  ([id number], [birth year]) on 09/28/20 at  7:45 PM EDT by a video-enabled telemedicine application and verified that I am speaking with the correct person using two identifiers.  Location: Patient: Virtual Visit Location Patient: Home Provider: Virtual Visit Location Provider: Home Office   I discussed the limitations of evaluation and  management by telemedicine and the availability of in person appointments. The patient expressed understanding and agreed to proceed.    History of Present Illness: Kara Bradley is a 55 y.o. who identifies as a female who was assigned female at birth, and is being seen today for covid 73.  HPI: URI  This is a new problem. Episode onset: tested positive for covid 19 today, symptoms started yesterday. The problem has been gradually worsening. The maximum temperature recorded prior to her arrival was 101 - 101.9 F. The fever has been present for Less than 1 day. Associated symptoms include congestion, coughing, headaches, rhinorrhea, sinus pain and a sore throat. Associated symptoms comments: Body aches, fatigue, chills. She has tried acetaminophen, antihistamine, decongestant, increased fluids, NSAIDs and sleep for the symptoms. The treatment provided mild relief.     Problems:  Patient Active Problem List   Diagnosis Date Noted   Abnormal microbiological findings in specimens from other organs, systems and tissues    Interstitial cystitis (chronic) without hematuria    Systemic lupus erythematosus, unspecified ([place])    Osteoporosis    Postmenopausal 10/19/2015   Special screening for malignant neoplasms, colon 10/13/2013   Wrist fracture 02/09/2013   Colles' fracture of left radius 12/22/2012   ANEMIA 12/05/2008   LEUKOPENIA, MILD 12/05/2008   EARLY SATIETY 12/05/2008   WEIGHT LOSS 12/05/2008   ABDOMINAL BLOATING  12/05/2008    Allergies:  Allergies  Allergen Reactions   Cephalexin Hives, Rash and Other (See Comments)   Penicillin G Other (See Comments) and Rash   Penicillins Hives and Rash    DID THE REACTION INVOLVE: Swelling of the face/tongue/throat, SOB, or low BP? No Sudden or severe rash/hives, skin peeling, or the inside of the mouth or nose? Yes Did it require medical treatment? Unknown When did it last happen?childhood reaction If all above answers are "NO", may  proceed with cephalosporin use.    Sulfamethoxazole-Trimethoprim Other (See Comments) and Rash    Joint pain.   Medications:  Current Outpatient Medications:    benzonatate (TESSALON) 100 MG capsule, Take 1 capsule (100 mg total) by mouth 3 (three) times daily as needed., Disp: 30 capsule, Rfl: 0   molnupiravir EUA 200 mg CAPS, Take 4 capsules (800 mg total) by mouth 2 (two) times daily for 5 days., Disp: 40 capsule, Rfl: 0   acetaminophen (TYLENOL) 500 MG tablet, as needed., Disp: , Rfl:    ALOE VERA PO, Take 1 capsule by mouth every evening., Disp: , Rfl:    Calcium Carb-Cholecalciferol (CALCIUM+D3 PO), Take 1 tablet by mouth 2 (two) times daily., Disp: , Rfl:    calcium carbonate (OSCAL) 1500 (600 Ca) MG TABS tablet, , Disp: , Rfl:    calcium carbonate (TUMS - DOSED IN MG ELEMENTAL CALCIUM) 500 MG chewable tablet, 1 tablet, Disp: , Rfl:    Calcium Carbonate-Vit D-Min (CALTRATE 600+D PLUS PO), Take 600 mg by mouth in the morning and at bedtime., Disp: , Rfl:    Cholecalciferol (VITAMIN D3) 50 MCG (2000 UT) TABS, Take 2 tablets by mouth daily., Disp: , Rfl:    CLARITIN REDITABS 5 MG TBDP, Take 5 mg by mouth at bedtime., Disp: , Rfl:    clobetasol (TEMOVATE) 0.05 % external solution, Apply 1 application topically 2 (two) times daily as needed. Do not apply to the face, groin, and under arms., Disp: 25 mL, Rfl: 3   clobetasol cream (TEMOVATE) [id number] %, Apply 1 application topically at bedtime. , Disp: , Rfl:    denosumab (PROLIA) 60 MG/ML SOSY injection, INJECT 60 MG INTO THE SKIN EVERY 6 MONTHS, Disp: 1 mL, Rfl: 1   estradiol (ESTRACE) 0.1 MG/GM vaginal cream, Estradiol 0.01% cream, compounded, 1 click applied to the vagina daily., Disp: 42.5 g, Rfl: 12   fluticasone (CUTIVATE) 0.005 % ointment, Apply 1 application topically at bedtime. , Disp: , Rfl:    hydroxychloroquine (PLAQUENIL) 200 MG tablet, TAKE 1 & 1/2 TABLETS BY MOUTH DAILY WITH FOOD OR MILK, Disp: 135 tablet, Rfl: 0    hydroxychloroquine (PLAQUENIL) 200 MG tablet, TAKE 1 & 1/2 TABLETS BY MOUTH WITH FOOD OR MILK DAILY, Disp: 135 tablet, Rfl: 0   hydroxychloroquine (PLAQUENIL) 200 MG tablet, Take 1.5 tablets (300 mg total) by mouth daily with food or milk, Disp: 135 tablet, Rfl: 1   ibuprofen (ADVIL) 200 MG tablet, as needed., Disp: , Rfl:    Multiple Vitamins-Minerals (MULTIVITAMINS THER. W/MINERALS) TABS tablet, Take 1 tablet by mouth daily. , Disp: , Rfl:    triamcinolone (NASACORT) 55 MCG/ACT AERO nasal inhaler, Place 1 spray into the nose daily as needed (for allergies.). , Disp: , Rfl:   Observations/Objective: Patient is well-developed, well-nourished in no acute distress.  Resting comfortably at home.  Head is normocephalic, atraumatic.  No labored breathing.  Speech is clear and coherent with logical content.  Patient is alert and oriented at baseline.  Dry cough heard a few times without effecting speech  Assessment and Plan: 1. COVID-19 - MyChart COVID-19 home monitoring program; Future - molnupiravir EUA 200 mg CAPS; Take 4 capsules (800 mg total) by mouth 2 (two) times daily for 5 days.  Dispense: 40 capsule; Refill: 0 - benzonatate (TESSALON) 100 MG capsule; Take 1 capsule (100 mg total) by mouth 3 (three) times daily as needed.  Dispense: 30 capsule; Refill: 0  - Continue OTC symptomatic management of choice - Will send OTC vitamins and supplement information through AVS - Molnupiravir prescribed - Tessalon perles for cough - Patient enrolled in MyChart symptom monitoring - Push fluids - Rest as needed - Discussed return precautions and when to seek in-person evaluation, sent via AVS as well  Follow Up Instructions: I discussed the assessment and treatment plan with the patient. The patient was provided an opportunity to ask questions and all were answered. The patient agreed with the plan and demonstrated an understanding of the instructions.  A copy of instructions were sent to the  patient via MyChart.  The patient was advised to call back or seek an in-person evaluation if the symptoms worsen or if the condition fails to improve as anticipated.  Time:  I spent 15 minutes with the patient via telehealth technology discussing the above problems/concerns.    Mar Daring, PA-C

## 2020-09-29 ENCOUNTER — Encounter (INDEPENDENT_AMBULATORY_CARE_PROVIDER_SITE_OTHER): Payer: Self-pay

## 2020-09-29 ENCOUNTER — Telehealth: Payer: Self-pay

## 2020-09-29 ENCOUNTER — Other Ambulatory Visit (HOSPITAL_COMMUNITY): Payer: Self-pay

## 2020-09-29 NOTE — Telephone Encounter (Signed)
Patient states that she has been taking Mucinex for her cough. Patients feels as if it has gotten worse. Patient will pick up anti viral medication and Tessalon Pear[place] today. Patient was advised on worsening cough per protocol below.  If cough remains the same or better: continue to treat with over the counter medications. Hard candy or cough drops and drinking warm fluids. Adults can also use honey 2 tsp (10 ML) at bedtime.   HONEY IS NOT RECOMMENDED FOR INFANTS UNDER ONE.   If cough is becoming worse even with the use of over the counter medications and patient is not able to sleep at night, cough becomes productive with sputum that maybe yellow or green in color, contact PCP.

## 2020-11-14 DIAGNOSIS — Z79899 Other long term (current) drug therapy: Secondary | ICD-10-CM | POA: Diagnosis not present

## 2020-11-14 DIAGNOSIS — H52203 Unspecified astigmatism, bilateral: Secondary | ICD-10-CM | POA: Diagnosis not present

## 2020-11-14 DIAGNOSIS — H524 Presbyopia: Secondary | ICD-10-CM | POA: Diagnosis not present

## 2020-11-14 DIAGNOSIS — H5213 Myopia, bilateral: Secondary | ICD-10-CM | POA: Diagnosis not present

## 2020-11-14 DIAGNOSIS — M3210 Systemic lupus erythematosus, organ or system involvement unspecified: Secondary | ICD-10-CM | POA: Diagnosis not present

## 2020-11-20 ENCOUNTER — Other Ambulatory Visit (HOSPITAL_COMMUNITY): Payer: Self-pay

## 2020-11-22 ENCOUNTER — Other Ambulatory Visit: Payer: Self-pay

## 2020-11-22 ENCOUNTER — Encounter: Payer: Self-pay | Admitting: Internal Medicine

## 2020-11-22 ENCOUNTER — Ambulatory Visit: Payer: 59 | Admitting: Internal Medicine

## 2020-11-22 VITALS — BP 97/59 | HR 81 | Temp 98.2°F | Ht 62.0 in | Wt 107.1 lb

## 2020-11-22 DIAGNOSIS — M810 Age-related osteoporosis without current pathological fracture: Secondary | ICD-10-CM | POA: Diagnosis not present

## 2020-11-22 DIAGNOSIS — R195 Other fecal abnormalities: Secondary | ICD-10-CM | POA: Insufficient documentation

## 2020-11-22 DIAGNOSIS — Z0001 Encounter for general adult medical examination with abnormal findings: Secondary | ICD-10-CM | POA: Diagnosis not present

## 2020-11-22 DIAGNOSIS — Z23 Encounter for immunization: Secondary | ICD-10-CM | POA: Diagnosis not present

## 2020-11-22 DIAGNOSIS — N3010 Interstitial cystitis (chronic) without hematuria: Secondary | ICD-10-CM

## 2020-11-22 DIAGNOSIS — M329 Systemic lupus erythematosus, unspecified: Secondary | ICD-10-CM | POA: Diagnosis not present

## 2020-11-22 DIAGNOSIS — Z0000 Encounter for general adult medical examination without abnormal findings: Secondary | ICD-10-CM

## 2020-11-22 DIAGNOSIS — Z7689 Persons encountering health services in other specified circumstances: Secondary | ICD-10-CM | POA: Diagnosis not present

## 2020-11-22 NOTE — Assessment & Plan Note (Addendum)
H/o UTIs in the past Uses estradiol cream for possible atrophic vaginitis, now better

## 2020-11-22 NOTE — Assessment & Plan Note (Signed)
Care established History and medications reviewed with the patient 
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
## 2020-11-22 NOTE — Assessment & Plan Note (Signed)
Recent change in stool color likely benign No melena or hematochezia, reassured.

## 2020-11-22 NOTE — Patient Instructions (Signed)
Please continue to take medications as prescribed.  Please get fasting blood tests done before the next visit.

## 2020-11-22 NOTE — Assessment & Plan Note (Signed)
Well-controlled with Plaquenil Followed by rheumatology and dermatology Sees Ophthalmology for retina exam as she is on Plaquenil

## 2020-11-22 NOTE — Assessment & Plan Note (Signed)
She gets Prolia from Rheumatology office On Caltrate 600 plus D3 supplement H/o chronic steroids for SLE in the past

## 2020-11-30 ENCOUNTER — Encounter (INDEPENDENT_AMBULATORY_CARE_PROVIDER_SITE_OTHER): Payer: 59 | Admitting: Internal Medicine

## 2021-01-18 ENCOUNTER — Other Ambulatory Visit (HOSPITAL_COMMUNITY): Payer: Self-pay | Admitting: Internal Medicine

## 2021-01-18 DIAGNOSIS — Z1231 Encounter for screening mammogram for malignant neoplasm of breast: Secondary | ICD-10-CM

## 2021-01-25 ENCOUNTER — Encounter: Payer: Self-pay | Admitting: Internal Medicine

## 2021-01-26 ENCOUNTER — Other Ambulatory Visit: Payer: Self-pay | Admitting: *Deleted

## 2021-01-26 MED ORDER — ESTRADIOL 0.1 MG/GM VA CREA: TOPICAL_CREAM | VAGINAL | 12 refills | Status: DC

## 2021-02-08 ENCOUNTER — Ambulatory Visit (HOSPITAL_COMMUNITY)
Admission: RE | Admit: 2021-02-08 | Discharge: 2021-02-08 | Disposition: A | Discharge status: Home / Self Care | Payer: 59 | Source: Ambulatory Visit | Attending: Internal Medicine | Admitting: Internal Medicine

## 2021-02-08 ENCOUNTER — Other Ambulatory Visit: Payer: Self-pay

## 2021-02-08 DIAGNOSIS — Z1231 Encounter for screening mammogram for malignant neoplasm of breast: Secondary | ICD-10-CM | POA: Insufficient documentation

## 2021-02-23 DIAGNOSIS — M810 Age-related osteoporosis without current pathological fracture: Secondary | ICD-10-CM | POA: Diagnosis not present

## 2021-02-23 DIAGNOSIS — Z79899 Other long term (current) drug therapy: Secondary | ICD-10-CM | POA: Diagnosis not present

## 2021-02-23 DIAGNOSIS — L932 Other local lupus erythematosus: Secondary | ICD-10-CM | POA: Diagnosis not present

## 2021-02-23 DIAGNOSIS — K121 Other forms of stomatitis: Secondary | ICD-10-CM | POA: Diagnosis not present

## 2021-02-27 ENCOUNTER — Ambulatory Visit: Payer: 59 | Attending: Internal Medicine | Admitting: Pharmacist

## 2021-02-27 ENCOUNTER — Telehealth: Payer: Self-pay | Admitting: Pharmacist

## 2021-02-27 ENCOUNTER — Other Ambulatory Visit: Payer: Self-pay

## 2021-02-27 DIAGNOSIS — Z7189 Other specified counseling: Secondary | ICD-10-CM

## 2021-02-27 NOTE — Telephone Encounter (Signed)
Called patient to schedule an appointment for the [facility]. I was unable to reach the patient so I left a HIPAA-compliant message requesting that the patient return my call.   Luke Van Ausdall, PharmD, BCACP, CPP Clinical Pharmacist [facility] [phone]  

## 2021-02-27 NOTE — Progress Notes (Signed)
S:  Patient presents for review of their specialty medication therapy.  Patient is currently taking Prolia for low bone density. Patient is managed by Dr. Dossie Der for this.   Adherence: confirms; has been on medication for ~2 years  Efficacy: reports that Dr. Dossie Der is pleased with results. Had a density scan 02/02/20 and is not due for another one until 01/2022  Dosing: 60 mg q56months  Dose adjustments: Renal: Monitor patients with severe impairment (CrCl <30 mL/minute or on dialysis) closely, as significant and prolonged hypocalcemia (incidence of 29% and potentially lasting weeks to months) and marked elevations of serum parathyroid hormone are serious risks in this population. Ensure adequate calcium and vitamin D intake/supplementation. CrCl ?30 mL/minute: No dosage adjustment necessary. CrCl <30 mL/minute: No dosage adjustment necessary; use in conjunction with guidance from patient's nephrology team. Hepatic: no dose adjustments (has not been studied)  Monitoring: S/sx of infection: none  S/sx of hypersensitivity: none S/sx of hypocalcemia/hypercalcemia: none  Hip, thigh or groin pain: none Jaw pain: none  Side effects: Dermatitis/skin rash: none Peripheral edema: none HA: none GI upset: none  Other side effects: none reported   O:   Lab Results  Component Value Date   WBC 5.3 11/30/2019   HGB 13.8 11/30/2019   HCT 40.0 11/30/2019   MCV 93.7 11/30/2019   PLT 217 11/30/2019      Chemistry      Component Value Date/Time   NA 140 08/17/2019 1546   K 4.3 08/17/2019 1546   CL 104 08/17/2019 1546   CO2 29 08/17/2019 1546   BUN 16 08/17/2019 1546   CREATININE 0.75 08/17/2019 1546      Component Value Date/Time   CALCIUM 10.0 08/17/2019 1546   ALKPHOS 37 (L) 10/14/2018 0711   AST 19 08/17/2019 1546   ALT 19 08/17/2019 1546   BILITOT 0.4 08/17/2019 1546      A/P: 1. Medication review: Patient currently on Prolia for osteoporosis. Reviewed the medication with  the patient, including the following: Prolia (denosumab) is a monoclonal antibody with affinity for nuclear factor-kappa ligand (RANKL). Prolia binds to RANKL and prevents osteoclast formation, leading to decreased bone resorption and increased bone mass in osteoporosis. Patient educated on purpose, proper use, and potential adverse effects of Prolia. The most common adverse effects are hypersensitivities, peripheral edema, dermatitis/skin rash, GI upset, HA, joint pain, and infection. There is the possibility of atypical femur fracture, serum calcium disturbances, and osteonecrosis of the jaw. Prolia exists as a solution prefilled syringe for SQ administration. Administration: SubQ route only and should not be administered IV, IM, or intradermally. Prior to administration, bring to room temperature in original container (allow to stand ~15 to 30 minutes); do not warm by any other method. Solution may contain trace amounts of translucent to white protein particles; do not use if cloudy, discolored (normal solution should be clear and colorless to pale yellow), or contains excessive particles or foreign matter. Avoid vigorous shaking. Administer via SubQ injection in the upper arm, upper thigh, or abdomen; should only be administered by a health care professional. Pt reports her specialist plans to complete 5 years of therapy. No recommendations for any changes at this time.  Benard Halsted, PharmD, [place], [place] [phone]

## 2021-03-01 ENCOUNTER — Other Ambulatory Visit (HOSPITAL_COMMUNITY): Payer: Self-pay

## 2021-03-01 ENCOUNTER — Ambulatory Visit: Payer: 59 | Admitting: Pharmacist

## 2021-03-02 ENCOUNTER — Other Ambulatory Visit (HOSPITAL_COMMUNITY): Payer: Self-pay

## 2021-03-09 ENCOUNTER — Other Ambulatory Visit (HOSPITAL_COMMUNITY): Payer: Self-pay

## 2021-03-19 ENCOUNTER — Other Ambulatory Visit (HOSPITAL_COMMUNITY): Payer: Self-pay

## 2021-03-27 ENCOUNTER — Other Ambulatory Visit (HOSPITAL_COMMUNITY): Payer: Self-pay

## 2021-03-27 MED ORDER — HYDROXYCHLOROQUINE SULFATE 200 MG PO TABS
200.0000 mg | ORAL_TABLET | Freq: Every day | ORAL | 1 refills | Status: DC
  Filled 2021-03-27: qty 90, 90d supply, fill #0
  Filled 2021-06-27: qty 90, 90d supply, fill #1

## 2021-03-28 DIAGNOSIS — M810 Age-related osteoporosis without current pathological fracture: Secondary | ICD-10-CM | POA: Diagnosis not present

## 2021-05-17 ENCOUNTER — Other Ambulatory Visit (HOSPITAL_COMMUNITY)
Admission: RE | Admit: 2021-05-17 | Discharge: 2021-05-17 | Disposition: A | Discharge status: Home / Self Care | Payer: 59 | Source: Ambulatory Visit | Attending: Internal Medicine | Admitting: Internal Medicine

## 2021-05-17 DIAGNOSIS — Z0000 Encounter for general adult medical examination without abnormal findings: Secondary | ICD-10-CM | POA: Insufficient documentation

## 2021-05-17 LAB — COMPREHENSIVE METABOLIC PANEL
ALT: 24 U/L (ref 0–44)
AST: 22 U/L (ref 15–41)
Albumin: 4.4 g/dL (ref 3.5–5.0)
Alkaline Phosphatase: 38 U/L (ref 38–126)
Anion gap: 10 (ref 5–15)
BUN: 21 mg/dL — ABNORMAL HIGH (ref 6–20)
CO2: 28 mmol/L (ref 22–32)
Calcium: 9.5 mg/dL (ref 8.9–10.3)
Chloride: 104 mmol/L (ref 98–111)
Creatinine, Ser: 0.79 mg/dL (ref 0.44–1.00)
GFR, Estimated: >60 mL/min (ref >60)
Glucose, Bld: 86 mg/dL (ref 70–99)
Potassium: 4.3 mmol/L (ref 3.5–5.1)
Sodium: 142 mmol/L (ref 135–145)
Total Bilirubin: 0.6 mg/dL (ref 0.3–1.2)
Total Protein: 7.9 g/dL (ref 6.5–8.1)

## 2021-05-17 LAB — LIPID PANEL
Cholesterol: 183 mg/dL (ref 0–200)
HDL: 83 mg/dL (ref >40)
LDL Cholesterol: 91 mg/dL (ref 0–99)
Total CHOL/HDL Ratio: 2.2 RATIO
Triglycerides: 47 mg/dL (ref <150)
VLDL: 9 mg/dL (ref 0–40)

## 2021-05-17 LAB — CBC WITH DIFFERENTIAL/PLATELET
Abs Immature Granulocytes: 0.01 10*3/uL (ref 0.00–0.07)
Basophils Absolute: 0.1 10*3/uL (ref 0.0–0.1)
Basophils Relative: 1 %
Eosinophils Absolute: 0 10*3/uL (ref 0.0–0.5)
Eosinophils Relative: 1 %
HCT: 42.1 % (ref 36.0–46.0)
Hemoglobin: 13.9 g/dL (ref 12.0–15.0)
Immature Granulocytes: 0 %
Lymphocytes Relative: 33 %
Lymphs Abs: 1.2 10*3/uL (ref 0.7–4.0)
MCH: 31.7 pg (ref 26.0–34.0)
MCHC: 33 g/dL (ref 30.0–36.0)
MCV: 95.9 fL (ref 80.0–100.0)
Monocytes Absolute: 0.3 10*3/uL (ref 0.1–1.0)
Monocytes Relative: 9 %
Neutro Abs: 2 10*3/uL (ref 1.7–7.7)
Neutrophils Relative %: 56 %
Platelets: 215 10*3/uL (ref 150–400)
RBC: 4.39 MIL/uL (ref 3.87–5.11)
RDW: 11.8 % (ref 11.5–15.5)
WBC: 3.7 10*3/uL — ABNORMAL LOW (ref 4.0–10.5)
nRBC: 0 % (ref 0.0–0.2)

## 2021-05-17 LAB — TSH: TSH: 3.336 u[IU]/mL (ref 0.350–4.500)

## 2021-05-18 LAB — HEMOGLOBIN A1C
Hgb A1c MFr Bld: 5.4 % (ref 4.8–5.6)
Mean Plasma Glucose: 108 mg/dL

## 2021-05-24 ENCOUNTER — Ambulatory Visit (INDEPENDENT_AMBULATORY_CARE_PROVIDER_SITE_OTHER): Payer: 59 | Admitting: Internal Medicine

## 2021-05-24 ENCOUNTER — Encounter: Payer: Self-pay | Admitting: Internal Medicine

## 2021-05-24 VITALS — BP 105/69 | HR 73 | Ht 62.0 in | Wt 114.1 lb

## 2021-05-24 DIAGNOSIS — Z23 Encounter for immunization: Secondary | ICD-10-CM

## 2021-05-24 DIAGNOSIS — Z0001 Encounter for general adult medical examination with abnormal findings: Secondary | ICD-10-CM | POA: Diagnosis not present

## 2021-05-24 DIAGNOSIS — N3010 Interstitial cystitis (chronic) without hematuria: Secondary | ICD-10-CM

## 2021-05-24 DIAGNOSIS — M810 Age-related osteoporosis without current pathological fracture: Secondary | ICD-10-CM

## 2021-05-24 DIAGNOSIS — M329 Systemic lupus erythematosus, unspecified: Secondary | ICD-10-CM | POA: Diagnosis not present

## 2021-05-24 NOTE — Patient Instructions (Signed)
Please continue to take medications as prescribed.  Please continue to follow heart healthy diet and perform moderate exercise/walking at least 150 mins/week.  

## 2021-05-24 NOTE — Assessment & Plan Note (Signed)
She gets Prolia from Rheumatology office ?On Caltrate 600 plus D3 supplement ?H/o chronic steroids for SLE in the past ?
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
## 2021-05-24 NOTE — Assessment & Plan Note (Signed)
Physical exam as documented. ?Blood tests reviewed and discussed with the patient in detail. ?Shingrix second dose given today. ?

## 2021-05-24 NOTE — Assessment & Plan Note (Signed)
H/o UTIs in the past ?Uses estradiol cream for possible atrophic vaginitis, now better ?

## 2021-05-24 NOTE — Assessment & Plan Note (Signed)
Well-controlled with Plaquenil ?Followed by rheumatology and dermatology ?Sees Ophthalmology for retina exam as she is on Plaquenil ?

## 2021-06-27 ENCOUNTER — Other Ambulatory Visit (HOSPITAL_COMMUNITY): Payer: Self-pay

## 2021-08-02 DIAGNOSIS — L930 Discoid lupus erythematosus: Secondary | ICD-10-CM | POA: Diagnosis not present

## 2021-08-02 DIAGNOSIS — D225 Melanocytic nevi of trunk: Secondary | ICD-10-CM | POA: Diagnosis not present

## 2021-08-02 DIAGNOSIS — Z1283 Encounter for screening for malignant neoplasm of skin: Secondary | ICD-10-CM | POA: Diagnosis not present

## 2021-08-02 DIAGNOSIS — L218 Other seborrheic dermatitis: Secondary | ICD-10-CM | POA: Diagnosis not present

## 2021-08-07 ENCOUNTER — Other Ambulatory Visit (HOSPITAL_COMMUNITY): Payer: Self-pay

## 2021-08-07 MED ORDER — CLOBETASOL PROPIONATE 0.05 % EX SOLN
Freq: Two times a day (BID) | CUTANEOUS | 3 refills | Status: DC | PRN
  Filled 2021-08-07: qty 50, 30d supply, fill #0

## 2021-08-07 MED ORDER — CLOBETASOL PROPIONATE 0.05 % EX CREA
TOPICAL_CREAM | Freq: Two times a day (BID) | CUTANEOUS | 3 refills | Status: DC | PRN
  Filled 2021-08-07: qty 60, 15d supply, fill #0

## 2021-09-06 ENCOUNTER — Other Ambulatory Visit (HOSPITAL_COMMUNITY): Payer: Self-pay

## 2021-09-13 ENCOUNTER — Other Ambulatory Visit: Payer: Self-pay | Admitting: Pharmacist

## 2021-09-13 ENCOUNTER — Other Ambulatory Visit (HOSPITAL_COMMUNITY): Payer: Self-pay

## 2021-09-13 MED ORDER — DENOSUMAB 60 MG/ML ~~LOC~~ SOSY: 60.0000 mg | PREFILLED_SYRINGE | SUBCUTANEOUS | 1 refills | Status: DC

## 2021-09-13 MED ORDER — DENOSUMAB 60 MG/ML ~~LOC~~ SOSY
60.0000 mg | PREFILLED_SYRINGE | SUBCUTANEOUS | 1 refills | Status: DC
  Filled 2021-09-13: qty 1, 180d supply, fill #0
  Filled 2022-03-04: qty 1, 180d supply, fill #1

## 2021-09-18 ENCOUNTER — Other Ambulatory Visit (HOSPITAL_COMMUNITY): Payer: Self-pay

## 2021-09-19 ENCOUNTER — Other Ambulatory Visit (HOSPITAL_COMMUNITY): Payer: Self-pay

## 2021-09-21 ENCOUNTER — Other Ambulatory Visit (HOSPITAL_COMMUNITY): Payer: Self-pay

## 2021-09-24 ENCOUNTER — Other Ambulatory Visit (HOSPITAL_COMMUNITY): Payer: Self-pay

## 2021-09-24 MED ORDER — HYDROXYCHLOROQUINE SULFATE 200 MG PO TABS
200.0000 mg | ORAL_TABLET | Freq: Every day | ORAL | 0 refills | Status: DC
  Filled 2021-09-24: qty 90, 90d supply, fill #0

## 2021-09-26 DIAGNOSIS — M810 Age-related osteoporosis without current pathological fracture: Secondary | ICD-10-CM | POA: Diagnosis not present

## 2021-10-01 DIAGNOSIS — Z79899 Other long term (current) drug therapy: Secondary | ICD-10-CM | POA: Diagnosis not present

## 2021-10-01 DIAGNOSIS — L932 Other local lupus erythematosus: Secondary | ICD-10-CM | POA: Diagnosis not present

## 2021-10-01 DIAGNOSIS — M810 Age-related osteoporosis without current pathological fracture: Secondary | ICD-10-CM | POA: Diagnosis not present

## 2021-10-01 DIAGNOSIS — K121 Other forms of stomatitis: Secondary | ICD-10-CM | POA: Diagnosis not present

## 2021-10-12 ENCOUNTER — Other Ambulatory Visit (HOSPITAL_COMMUNITY): Payer: Self-pay

## 2021-11-22 DIAGNOSIS — H52203 Unspecified astigmatism, bilateral: Secondary | ICD-10-CM | POA: Diagnosis not present

## 2021-11-22 DIAGNOSIS — M3210 Systemic lupus erythematosus, organ or system involvement unspecified: Secondary | ICD-10-CM | POA: Diagnosis not present

## 2021-11-22 DIAGNOSIS — Z79899 Other long term (current) drug therapy: Secondary | ICD-10-CM | POA: Diagnosis not present

## 2021-11-22 DIAGNOSIS — H524 Presbyopia: Secondary | ICD-10-CM | POA: Diagnosis not present

## 2021-11-22 DIAGNOSIS — H5213 Myopia, bilateral: Secondary | ICD-10-CM | POA: Diagnosis not present

## 2021-12-24 ENCOUNTER — Encounter: Payer: Self-pay | Admitting: Internal Medicine

## 2021-12-24 ENCOUNTER — Ambulatory Visit: Payer: 59 | Admitting: Internal Medicine

## 2021-12-24 VITALS — BP 115/71 | HR 74 | Ht 62.0 in | Wt 120.4 lb

## 2021-12-24 DIAGNOSIS — H66001 Acute suppurative otitis media without spontaneous rupture of ear drum, right ear: Secondary | ICD-10-CM | POA: Diagnosis not present

## 2021-12-24 MED ORDER — OFLOXACIN 0.3 % OT SOLN: 10.0000 [drp] | Freq: Every day | OTIC | 0 refills | Status: AC

## 2021-12-24 NOTE — Patient Instructions (Signed)
It was a pleasure to see you today.  Thank you for giving [place] the opportunity to be involved in your care.  Below is a brief recap of your visit and next steps.  We will plan to see you again in April  Summary I have prescribed ofloxacin ear drops (10 drops daily x 10 days). Please let us know if your symptoms are not improving.

## 2021-12-24 NOTE — Progress Notes (Unsigned)
   Acute Office Visit  Subjective:     Patient ID: Kara Bradley, female    DOB: Jun 05, 1965, 56 y.o.   MRN: [id number]  Chief Complaint  Patient presents with   Ear Pain    Right ear pain and fullness   Kara Bradley is a 56 year old woman presenting for evaluation of acute right ear pain and fullness today.  She reports a 1 day history of right ear discomfort with changes in hearing as well.  She denies fever/chills and drainage or purulent discharge from the right ear.  Her left ear is not causing her any discomfort.  She reports a remote history of ear infections.  Review of Systems  Constitutional:  Negative for chills, fever and malaise/fatigue.  HENT:  Positive for ear pain (right ear). Negative for ear discharge.       Objective:    BP 115/71   Pulse 74   Ht '5\' 2"'$  (1.575 m)   Wt 120 lb 6.4 oz (54.6 kg)   SpO2 98%   BMI 22.02 kg/m   Physical Exam HENT:     Right Ear: External ear normal. Decreased hearing noted. Tenderness present. No drainage. Tympanic membrane is erythematous and bulging.     Left Ear: Hearing, tympanic membrane, ear canal and external ear normal.       Assessment & Plan:   Problem List Items Addressed This Visit       Acute otitis media - Primary    Presenting today for evaluation of right ear discomfort and fullness.  On exam she has an erythematous and bulging TM as well as decreased hearing, consistent with OM.  She has penicillin and cephalosporin allergies.  -I have prescribed ofloxacin 0.3% otic drops x10 days -She will return to care if her symptoms get worse or fail to improve       Meds ordered this encounter  Medications   ofloxacin (FLOXIN OTIC) 0.3 % OTIC solution    Sig: Place 10 drops into the right ear daily for 10 days.    Dispense:  5 mL    Refill:  0    Return if symptoms worsen or fail to improve.  Johnette Abraham, MD

## 2021-12-25 ENCOUNTER — Other Ambulatory Visit (HOSPITAL_COMMUNITY): Payer: Self-pay

## 2021-12-25 DIAGNOSIS — H6690 Otitis media, unspecified, unspecified ear: Secondary | ICD-10-CM | POA: Insufficient documentation

## 2021-12-25 NOTE — Assessment & Plan Note (Signed)
Presenting today for evaluation of right ear discomfort and fullness.  On exam she has an erythematous and bulging TM as well as decreased hearing, consistent with OM.  She has penicillin and cephalosporin allergies.  -I have prescribed ofloxacin 0.3% otic drops x10 days -She will return to care if her symptoms get worse or fail to improve

## 2021-12-26 ENCOUNTER — Other Ambulatory Visit (HOSPITAL_COMMUNITY): Payer: Self-pay

## 2021-12-26 MED ORDER — HYDROXYCHLOROQUINE SULFATE 200 MG PO TABS
200.0000 mg | ORAL_TABLET | Freq: Every day | ORAL | 0 refills | Status: DC
  Filled 2021-12-26: qty 90, 90d supply, fill #0

## 2021-12-27 ENCOUNTER — Other Ambulatory Visit (HOSPITAL_COMMUNITY): Payer: Self-pay

## 2022-02-08 ENCOUNTER — Other Ambulatory Visit (HOSPITAL_COMMUNITY): Payer: Self-pay | Admitting: Internal Medicine

## 2022-02-08 DIAGNOSIS — Z1231 Encounter for screening mammogram for malignant neoplasm of breast: Secondary | ICD-10-CM

## 2022-02-14 ENCOUNTER — Ambulatory Visit (HOSPITAL_COMMUNITY)
Admission: RE | Admit: 2022-02-14 | Discharge: 2022-02-14 | Disposition: A | Discharge status: Home / Self Care | Payer: 59 | Source: Ambulatory Visit | Attending: Internal Medicine | Admitting: Internal Medicine

## 2022-02-14 DIAGNOSIS — Z1231 Encounter for screening mammogram for malignant neoplasm of breast: Secondary | ICD-10-CM | POA: Insufficient documentation

## 2022-02-15 ENCOUNTER — Other Ambulatory Visit: Payer: Self-pay

## 2022-02-22 ENCOUNTER — Ambulatory Visit: Payer: 59 | Attending: Internal Medicine | Admitting: Pharmacist

## 2022-02-22 DIAGNOSIS — M810 Age-related osteoporosis without current pathological fracture: Secondary | ICD-10-CM

## 2022-02-22 NOTE — Progress Notes (Signed)
S:  Patient presents for review of their specialty medication therapy.  Patient is currently taking Prolia for low bone density. Patient is managed by Dr. Dossie Der for this.   Adherence: confirms.   Efficacy: reports that Dr. Dossie Der is pleased with results. Has another dose and BMD scan in Feb.  Dosing: 60 mg q47month  Dose adjustments: Renal: Monitor patients with severe impairment (CrCl <30 mL/minute or on dialysis) closely, as significant and prolonged hypocalcemia (incidence of 29% and potentially lasting weeks to months) and marked elevations of serum parathyroid hormone are serious risks in this population. Ensure adequate calcium and vitamin D intake/supplementation. CrCl ?30 mL/minute: No dosage adjustment necessary. CrCl <30 mL/minute: No dosage adjustment necessary; use in conjunction with guidance from patient's nephrology team. Hepatic: no dose adjustments (has not been studied)  Monitoring: S/sx of infection: none  S/sx of hypersensitivity: none S/sx of hypocalcemia/hypercalcemia: none  Hip, thigh or groin pain: none Jaw pain: none  Side effects: Dermatitis/skin rash: none Peripheral edema: none HA: none GI upset: none  Other side effects: none reported   O:   Lab Results  Component Value Date   WBC 3.7 (L) 05/17/2021   HGB 13.9 05/17/2021   HCT 42.1 05/17/2021   MCV 95.9 05/17/2021   PLT 215 05/17/2021      Chemistry      Component Value Date/Time   NA 142 05/17/2021 0600   K 4.3 05/17/2021 0600   CL 104 05/17/2021 0600   CO2 28 05/17/2021 0600   BUN 21 (H) 05/17/2021 0600   CREATININE 0.79 05/17/2021 0600   CREATININE 0.75 08/17/2019 1546      Component Value Date/Time   CALCIUM 9.5 05/17/2021 0600   ALKPHOS 38 05/17/2021 0600   AST 22 05/17/2021 0600   ALT 24 05/17/2021 0600   BILITOT 0.6 05/17/2021 0600      A/P: 1. Medication review: Patient currently on Prolia for osteoporosis. Reviewed the medication with the patient, including the  following: Prolia (denosumab) is a monoclonal antibody with affinity for nuclear factor-kappa ligand (RANKL). Prolia binds to RANKL and prevents osteoclast formation, leading to decreased bone resorption and increased bone mass in osteoporosis. Patient educated on purpose, proper use, and potential adverse effects of Prolia. The most common adverse effects are hypersensitivities, peripheral edema, dermatitis/skin rash, GI upset, HA, joint pain, and infection. There is the possibility of atypical femur fracture, serum calcium disturbances, and osteonecrosis of the jaw. Prolia exists as a solution prefilled syringe for SQ administration. Administration: SubQ route only and should not be administered IV, IM, or intradermally. Prior to administration, bring to room temperature in original container (allow to stand ~15 to 30 minutes); do not warm by any other method. Solution may contain trace amounts of translucent to white protein particles; do not use if cloudy, discolored (normal solution should be clear and colorless to pale yellow), or contains excessive particles or foreign matter. Avoid vigorous shaking. Administer via SubQ injection in the upper arm, upper thigh, or abdomen; should only be administered by a health care professional. Pt reports her specialist plans to complete 5 years of therapy. No recommendations for any changes at this time.  LBenard Halsted PharmD, [facility] [place] [phone]

## 2022-03-01 ENCOUNTER — Other Ambulatory Visit (HOSPITAL_COMMUNITY): Payer: Self-pay

## 2022-03-04 ENCOUNTER — Other Ambulatory Visit (HOSPITAL_COMMUNITY): Payer: Self-pay

## 2022-03-05 ENCOUNTER — Other Ambulatory Visit (HOSPITAL_COMMUNITY): Payer: Self-pay

## 2022-03-08 ENCOUNTER — Other Ambulatory Visit (HOSPITAL_COMMUNITY): Payer: Self-pay

## 2022-03-19 ENCOUNTER — Other Ambulatory Visit (HOSPITAL_COMMUNITY): Payer: Self-pay

## 2022-03-19 ENCOUNTER — Other Ambulatory Visit: Payer: Self-pay

## 2022-03-25 ENCOUNTER — Other Ambulatory Visit (HOSPITAL_COMMUNITY): Payer: Self-pay

## 2022-03-26 ENCOUNTER — Other Ambulatory Visit (HOSPITAL_COMMUNITY): Payer: Self-pay

## 2022-03-27 ENCOUNTER — Other Ambulatory Visit (HOSPITAL_COMMUNITY): Payer: Self-pay

## 2022-03-27 MED ORDER — HYDROXYCHLOROQUINE SULFATE 200 MG PO TABS
200.0000 mg | ORAL_TABLET | Freq: Every day | ORAL | 0 refills | Status: DC
  Filled 2022-03-27: qty 90, 90d supply, fill #0

## 2022-03-28 ENCOUNTER — Other Ambulatory Visit (HOSPITAL_COMMUNITY): Payer: Self-pay

## 2022-04-08 DIAGNOSIS — M810 Age-related osteoporosis without current pathological fracture: Secondary | ICD-10-CM | POA: Diagnosis not present

## 2022-04-08 DIAGNOSIS — L932 Other local lupus erythematosus: Secondary | ICD-10-CM | POA: Diagnosis not present

## 2022-04-08 DIAGNOSIS — K121 Other forms of stomatitis: Secondary | ICD-10-CM | POA: Diagnosis not present

## 2022-04-08 DIAGNOSIS — Z79899 Other long term (current) drug therapy: Secondary | ICD-10-CM | POA: Diagnosis not present

## 2022-04-12 ENCOUNTER — Other Ambulatory Visit (HOSPITAL_COMMUNITY): Payer: Self-pay

## 2022-05-10 ENCOUNTER — Telehealth: Payer: Self-pay | Admitting: Internal Medicine

## 2022-05-10 ENCOUNTER — Other Ambulatory Visit: Payer: Self-pay | Admitting: Internal Medicine

## 2022-05-10 DIAGNOSIS — Z0001 Encounter for general adult medical examination with abnormal findings: Secondary | ICD-10-CM

## 2022-05-10 DIAGNOSIS — M329 Systemic lupus erythematosus, unspecified: Secondary | ICD-10-CM

## 2022-05-10 DIAGNOSIS — R739 Hyperglycemia, unspecified: Secondary | ICD-10-CM

## 2022-05-10 DIAGNOSIS — E782 Mixed hyperlipidemia: Secondary | ICD-10-CM

## 2022-05-10 DIAGNOSIS — N3010 Interstitial cystitis (chronic) without hematuria: Secondary | ICD-10-CM

## 2022-05-10 DIAGNOSIS — M810 Age-related osteoporosis without current pathological fracture: Secondary | ICD-10-CM

## 2022-05-10 NOTE — Telephone Encounter (Signed)
Patient called has a CPE coming up but no blood work was ordered. Please contact patient if she is to do bloodwork.

## 2022-05-10 NOTE — Telephone Encounter (Signed)
LVM

## 2022-05-15 ENCOUNTER — Other Ambulatory Visit (HOSPITAL_COMMUNITY)
Admission: RE | Admit: 2022-05-15 | Discharge: 2022-05-15 | Disposition: A | Discharge status: Home / Self Care | Payer: 59 | Source: Ambulatory Visit | Attending: Rheumatology | Admitting: Rheumatology

## 2022-05-15 ENCOUNTER — Other Ambulatory Visit (HOSPITAL_COMMUNITY)
Admission: RE | Admit: 2022-05-15 | Discharge: 2022-05-15 | Disposition: A | Discharge status: Home / Self Care | Payer: 59 | Source: Ambulatory Visit | Attending: Internal Medicine | Admitting: Internal Medicine

## 2022-05-15 DIAGNOSIS — E782 Mixed hyperlipidemia: Secondary | ICD-10-CM | POA: Insufficient documentation

## 2022-05-15 DIAGNOSIS — N3001 Acute cystitis with hematuria: Secondary | ICD-10-CM | POA: Diagnosis not present

## 2022-05-15 DIAGNOSIS — L932 Other local lupus erythematosus: Secondary | ICD-10-CM | POA: Insufficient documentation

## 2022-05-15 DIAGNOSIS — M329 Systemic lupus erythematosus, unspecified: Secondary | ICD-10-CM | POA: Diagnosis not present

## 2022-05-15 DIAGNOSIS — M810 Age-related osteoporosis without current pathological fracture: Secondary | ICD-10-CM | POA: Insufficient documentation

## 2022-05-15 DIAGNOSIS — Z0001 Encounter for general adult medical examination with abnormal findings: Secondary | ICD-10-CM | POA: Insufficient documentation

## 2022-05-15 LAB — COMPREHENSIVE METABOLIC PANEL
ALT: 27 U/L (ref 0–44)
AST: 27 U/L (ref 15–41)
Albumin: 4.1 g/dL (ref 3.5–5.0)
Alkaline Phosphatase: 43 U/L (ref 38–126)
Anion gap: 9 (ref 5–15)
BUN: 18 mg/dL (ref 6–20)
CO2: 26 mmol/L (ref 22–32)
Calcium: 9.4 mg/dL (ref 8.9–10.3)
Chloride: 103 mmol/L (ref 98–111)
Creatinine, Ser: 0.77 mg/dL (ref 0.44–1.00)
GFR, Estimated: >60 mL/min (ref >60)
Glucose, Bld: 92 mg/dL (ref 70–99)
Potassium: 3.8 mmol/L (ref 3.5–5.1)
Sodium: 138 mmol/L (ref 135–145)
Total Bilirubin: 0.7 mg/dL (ref 0.3–1.2)
Total Protein: 7.4 g/dL (ref 6.5–8.1)

## 2022-05-15 LAB — URINALYSIS, ROUTINE W REFLEX MICROSCOPIC
Bilirubin Urine: NEGATIVE
Glucose, UA: NEGATIVE mg/dL
Hgb urine dipstick: NEGATIVE
Ketones, ur: NEGATIVE mg/dL
Leukocytes,Ua: NEGATIVE
Nitrite: NEGATIVE
Protein, ur: NEGATIVE mg/dL
Specific Gravity, Urine: 1.018 (ref 1.005–1.030)
pH: 7 (ref 5.0–8.0)

## 2022-05-15 LAB — CBC WITH DIFFERENTIAL/PLATELET
Abs Immature Granulocytes: 0 10*3/uL (ref 0.00–0.07)
Basophils Absolute: 0 10*3/uL (ref 0.0–0.1)
Basophils Relative: 1 %
Eosinophils Absolute: 0.2 10*3/uL (ref 0.0–0.5)
Eosinophils Relative: 5 %
HCT: 39.7 % (ref 36.0–46.0)
Hemoglobin: 13.6 g/dL (ref 12.0–15.0)
Immature Granulocytes: 0 %
Lymphocytes Relative: 26 %
Lymphs Abs: 1.2 10*3/uL (ref 0.7–4.0)
MCH: 32.6 pg (ref 26.0–34.0)
MCHC: 34.3 g/dL (ref 30.0–36.0)
MCV: 95.2 fL (ref 80.0–100.0)
Monocytes Absolute: 0.3 10*3/uL (ref 0.1–1.0)
Monocytes Relative: 6 %
Neutro Abs: 2.7 10*3/uL (ref 1.7–7.7)
Neutrophils Relative %: 62 %
Platelets: 194 10*3/uL (ref 150–400)
RBC: 4.17 MIL/uL (ref 3.87–5.11)
RDW: 11.8 % (ref 11.5–15.5)
WBC: 4.4 10*3/uL (ref 4.0–10.5)
nRBC: 0 % (ref 0.0–0.2)

## 2022-05-15 LAB — LIPID PANEL
Cholesterol: 195 mg/dL (ref 0–200)
HDL: 83 mg/dL (ref >40)
LDL Cholesterol: 102 mg/dL — ABNORMAL HIGH (ref 0–99)
Total CHOL/HDL Ratio: 2.3 RATIO
Triglycerides: 50 mg/dL (ref <150)
VLDL: 10 mg/dL (ref 0–40)

## 2022-05-15 LAB — TSH: TSH: 3.61 u[IU]/mL (ref 0.350–4.500)

## 2022-05-15 LAB — VITAMIN D 25 HYDROXY (VIT D DEFICIENCY, FRACTURES): Vit D, 25-Hydroxy: 60.08 ng/mL (ref 30–100)

## 2022-05-16 LAB — HEMOGLOBIN A1C
Hgb A1c MFr Bld: 5.3 % (ref 4.8–5.6)
Mean Plasma Glucose: 105 mg/dL

## 2022-05-16 LAB — C3 COMPLEMENT: C3 Complement: 123 mg/dL (ref 82–167)

## 2022-05-16 LAB — C4 COMPLEMENT: Complement C4, Body Fluid: 15 mg/dL (ref 12–38)

## 2022-05-17 LAB — ANTI-DNA ANTIBODY, DOUBLE-STRANDED: ds DNA Ab: 1 IU/mL (ref 0–9)

## 2022-05-27 ENCOUNTER — Encounter: Payer: Self-pay | Admitting: Internal Medicine

## 2022-05-27 ENCOUNTER — Ambulatory Visit (INDEPENDENT_AMBULATORY_CARE_PROVIDER_SITE_OTHER): Payer: 59 | Admitting: Internal Medicine

## 2022-05-27 VITALS — BP 106/70 | HR 83 | Ht 62.0 in | Wt 120.8 lb

## 2022-05-27 DIAGNOSIS — M810 Age-related osteoporosis without current pathological fracture: Secondary | ICD-10-CM

## 2022-05-27 DIAGNOSIS — Z0001 Encounter for general adult medical examination with abnormal findings: Secondary | ICD-10-CM

## 2022-05-27 DIAGNOSIS — M329 Systemic lupus erythematosus, unspecified: Secondary | ICD-10-CM | POA: Diagnosis not present

## 2022-05-27 DIAGNOSIS — Z90710 Acquired absence of both cervix and uterus: Secondary | ICD-10-CM | POA: Diagnosis not present

## 2022-05-27 DIAGNOSIS — Z880 Allergy status to penicillin: Secondary | ICD-10-CM | POA: Diagnosis not present
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
## 2022-05-31 NOTE — Assessment & Plan Note (Signed)
Well-controlled with Plaquenil ?Followed by rheumatology and dermatology ?Sees Ophthalmology for retina exam as she is on Plaquenil ?

## 2022-05-31 NOTE — Assessment & Plan Note (Signed)
Physical exam as documented. Blood tests reviewed and discussed with the patient in detail. 

## 2022-05-31 NOTE — Assessment & Plan Note (Signed)
She gets Prolia from Rheumatology office ?On Caltrate 600 plus D3 supplement ?H/o chronic steroids for SLE in the past ?

## 2022-06-24 ENCOUNTER — Other Ambulatory Visit (HOSPITAL_COMMUNITY): Payer: Self-pay

## 2022-06-24 MED ORDER — HYDROXYCHLOROQUINE SULFATE 200 MG PO TABS
200.0000 mg | ORAL_TABLET | Freq: Every day | ORAL | 0 refills | Status: DC
  Filled 2022-06-24: qty 90, 90d supply, fill #0

## 2022-06-26 ENCOUNTER — Other Ambulatory Visit (HOSPITAL_COMMUNITY)
Admission: RE | Admit: 2022-06-26 | Discharge: 2022-06-26 | Disposition: A | Discharge status: Home / Self Care | Payer: 59 | Source: Ambulatory Visit | Attending: Internal Medicine | Admitting: Internal Medicine

## 2022-06-26 DIAGNOSIS — Z880 Allergy status to penicillin: Secondary | ICD-10-CM | POA: Diagnosis not present

## 2022-07-02 LAB — MISC LABCORP TEST (SEND OUT): Labcorp test code: 67124

## 2022-07-09 ENCOUNTER — Encounter: Payer: Self-pay | Admitting: Internal Medicine

## 2022-07-10 ENCOUNTER — Other Ambulatory Visit: Payer: Self-pay

## 2022-07-10 MED ORDER — ESTRADIOL 0.1 MG/GM VA CREA
TOPICAL_CREAM | VAGINAL | 12 refills | Status: DC
Start: 2022-07-10 — End: 2023-10-08

## 2022-07-15 ENCOUNTER — Encounter: Payer: Self-pay | Admitting: Internal Medicine

## 2022-07-18 ENCOUNTER — Ambulatory Visit: Payer: 59 | Admitting: Internal Medicine

## 2022-07-18 ENCOUNTER — Encounter: Payer: Self-pay | Admitting: Internal Medicine

## 2022-07-18 VITALS — BP 127/79 | HR 84 | Ht 62.0 in | Wt 122.6 lb

## 2022-07-18 DIAGNOSIS — J0100 Acute maxillary sinusitis, unspecified: Secondary | ICD-10-CM | POA: Diagnosis not present

## 2022-07-18 MED ORDER — AMOXICILLIN-POT CLAVULANATE 875-125 MG PO TABS
1.0000 | ORAL_TABLET | Freq: Two times a day (BID) | ORAL | 0 refills | Status: DC
Start: 2022-07-18 — End: 2023-01-15
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
## 2022-07-18 NOTE — Assessment & Plan Note (Signed)
Started empiric Augmentin as she has persistent symptoms despite symptomatic treatment Although chart suggests allergy to Keflex, she recently had negative penicillin allergy test - advised to contact if she has rash with Augmentin Continue Tessalon as needed for cough Mucinex as needed for cough Nasacort as needed for nasal congestion

## 2022-08-08 ENCOUNTER — Other Ambulatory Visit (HOSPITAL_COMMUNITY): Payer: Self-pay

## 2022-08-29 DIAGNOSIS — Z1283 Encounter for screening for malignant neoplasm of skin: Secondary | ICD-10-CM | POA: Diagnosis not present

## 2022-08-29 DIAGNOSIS — D225 Melanocytic nevi of trunk: Secondary | ICD-10-CM | POA: Diagnosis not present

## 2022-08-29 DIAGNOSIS — L638 Other alopecia areata: Secondary | ICD-10-CM | POA: Diagnosis not present

## 2022-08-29 DIAGNOSIS — L820 Inflamed seborrheic keratosis: Secondary | ICD-10-CM | POA: Diagnosis not present

## 2022-09-04 ENCOUNTER — Other Ambulatory Visit (HOSPITAL_COMMUNITY): Payer: Self-pay

## 2022-09-09 ENCOUNTER — Other Ambulatory Visit: Payer: Self-pay

## 2022-09-09 ENCOUNTER — Other Ambulatory Visit (HOSPITAL_COMMUNITY): Payer: Self-pay

## 2022-09-09 ENCOUNTER — Other Ambulatory Visit: Payer: Self-pay | Admitting: Pharmacist

## 2022-09-09 MED ORDER — PROLIA 60 MG/ML ~~LOC~~ SOSY
PREFILLED_SYRINGE | SUBCUTANEOUS | 0 refills | Status: DC
  Filled 2022-09-09: qty 1, 180d supply, fill #0

## 2022-09-09 MED ORDER — PROLIA 60 MG/ML ~~LOC~~ SOSY: PREFILLED_SYRINGE | SUBCUTANEOUS | 0 refills | Status: DC

## 2022-09-11 ENCOUNTER — Other Ambulatory Visit: Payer: Self-pay | Admitting: Oncology

## 2022-09-11 DIAGNOSIS — Z006 Encounter for examination for normal comparison and control in clinical research program: Secondary | ICD-10-CM

## 2022-09-20 ENCOUNTER — Other Ambulatory Visit (HOSPITAL_COMMUNITY): Payer: Self-pay

## 2022-09-22 ENCOUNTER — Other Ambulatory Visit (HOSPITAL_COMMUNITY): Payer: Self-pay

## 2022-09-23 ENCOUNTER — Other Ambulatory Visit (HOSPITAL_COMMUNITY): Payer: Self-pay

## 2022-09-23 MED ORDER — HYDROXYCHLOROQUINE SULFATE 200 MG PO TABS
200.0000 mg | ORAL_TABLET | Freq: Every day | ORAL | 0 refills | Status: DC
  Filled 2022-09-23: qty 90, 90d supply, fill #0

## 2022-09-27 ENCOUNTER — Other Ambulatory Visit (HOSPITAL_COMMUNITY)
Admission: RE | Admit: 2022-09-27 | Discharge: 2022-09-27 | Disposition: A | Discharge status: Home / Self Care | Payer: 59 | Source: Ambulatory Visit | Attending: Oncology | Admitting: Oncology

## 2022-09-27 DIAGNOSIS — Z006 Encounter for examination for normal comparison and control in clinical research program: Secondary | ICD-10-CM | POA: Insufficient documentation

## 2022-10-08 DIAGNOSIS — M810 Age-related osteoporosis without current pathological fracture: Secondary | ICD-10-CM | POA: Diagnosis not present

## 2022-10-08 DIAGNOSIS — K121 Other forms of stomatitis: Secondary | ICD-10-CM | POA: Diagnosis not present

## 2022-10-08 DIAGNOSIS — Z79899 Other long term (current) drug therapy: Secondary | ICD-10-CM | POA: Diagnosis not present

## 2022-10-08 DIAGNOSIS — L932 Other local lupus erythematosus: Secondary | ICD-10-CM | POA: Diagnosis not present

## 2022-10-08 LAB — GENECONNECT MOLECULAR SCREEN: Genetic Analysis Overall Interpretation: NEGATIVE

## 2022-10-08 LAB — HELIX MOLECULAR SCREEN

## 2022-11-02 ENCOUNTER — Encounter (HOSPITAL_COMMUNITY): Payer: Self-pay

## 2022-11-19 ENCOUNTER — Other Ambulatory Visit (HOSPITAL_COMMUNITY): Payer: Self-pay

## 2022-11-19 ENCOUNTER — Other Ambulatory Visit: Payer: Self-pay

## 2022-11-19 MED ORDER — CLOBETASOL PROPIONATE 0.05 % EX CREA
TOPICAL_CREAM | CUTANEOUS | 3 refills | Status: AC
  Filled 2022-11-19: qty 60, 30d supply, fill #0

## 2022-11-28 DIAGNOSIS — M329 Systemic lupus erythematosus, unspecified: Secondary | ICD-10-CM | POA: Diagnosis not present

## 2022-11-28 DIAGNOSIS — Z79899 Other long term (current) drug therapy: Secondary | ICD-10-CM | POA: Diagnosis not present

## 2022-11-28 DIAGNOSIS — H2513 Age-related nuclear cataract, bilateral: Secondary | ICD-10-CM | POA: Diagnosis not present

## 2022-12-20 ENCOUNTER — Other Ambulatory Visit: Payer: Self-pay

## 2022-12-20 ENCOUNTER — Other Ambulatory Visit (HOSPITAL_COMMUNITY): Payer: Self-pay

## 2022-12-20 ENCOUNTER — Telehealth: Payer: Self-pay | Admitting: Internal Medicine

## 2022-12-20 ENCOUNTER — Encounter: Payer: Self-pay | Admitting: Family Medicine

## 2022-12-20 ENCOUNTER — Ambulatory Visit: Payer: 59 | Admitting: Family Medicine

## 2022-12-20 ENCOUNTER — Encounter: Payer: Self-pay | Admitting: Pharmacist

## 2022-12-20 VITALS — BP 116/68 | HR 97 | Wt 125.0 lb

## 2022-12-20 DIAGNOSIS — N3090 Cystitis, unspecified without hematuria: Secondary | ICD-10-CM

## 2022-12-20 DIAGNOSIS — R399 Unspecified symptoms and signs involving the genitourinary system: Secondary | ICD-10-CM | POA: Diagnosis not present

## 2022-12-20 MED ORDER — NITROFURANTOIN MONOHYD MACRO 100 MG PO CAPS
100.0000 mg | ORAL_CAPSULE | Freq: Two times a day (BID) | ORAL | 0 refills | Status: DC
Start: 2022-12-20 — End: 2022-12-23
  Filled 2022-12-20 (×2): qty 10, 5d supply, fill #0

## 2022-12-20 NOTE — Patient Instructions (Addendum)
I appreciate the opportunity to provide care to you today!   Please ensure you complete the full course of the antibiotics as prescribed.  To help prevent future urinary tract infections (UTIs), consider the following practices:  Avoid Prolonged Urination: Do not hold urine for extended periods, as this can stretch the bladder and create an environment conducive to bacterial growth. Prompt Bladder Emptying: Empty your bladder as soon as you feel the urge. Post-Intercourse Hygiene: Empty your bladder soon after intercourse to reduce the risk of infection. Shower Instead of Bath: Opt for showers rather than baths to minimize bacterial exposure. Proper Wiping Technique: Wipe from front to back after urinating and bowel movements to prevent the transfer of bacteria from the anal region to the vagina and urethra. Hydration: Drink a full glass of water regularly to help flush bacteria from your system.    Attached with your AVS, you will find valuable resources for self-education. I highly recommend dedicating some time to thoroughly examine them.   Please continue to a heart-healthy diet and increase your physical activities. Try to exercise for at least five days a week.    It was a pleasure to see you and I look forward to continuing to work together on your health and well-being. Please do not hesitate to call the office if you need care or have questions about your care.  In case of emergency, please visit the Emergency Department for urgent care, or contact our clinic at [phone] to schedule an appointment. We're here to help you!   Have a wonderful day and week. With Gratitude, Gilmore Laroche MSN, FNP-BC
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
## 2022-12-20 NOTE — Assessment & Plan Note (Signed)
Please ensure you complete the full course of the antibiotics as prescribed.  To help prevent future urinary tract infections (UTIs), consider the following practices:  Avoid Prolonged Urination: Do not hold urine for extended periods, as this can stretch the bladder and create an environment conducive to bacterial growth. Prompt Bladder Emptying: Empty your bladder as soon as you feel the urge. Post-Intercourse Hygiene: Empty your bladder soon after intercourse to reduce the risk of infection. Shower Instead of Bath: Opt for showers rather than baths to minimize bacterial exposure. Proper Wiping Technique: Wipe from front to back after urinating and bowel movements to prevent the transfer of bacteria from the anal region to the vagina and urethra. Hydration: Drink a full glass of water regularly to help flush bacteria from your system.

## 2022-12-20 NOTE — Telephone Encounter (Signed)
Pt need nitrofurantoin, macrocrystal-monohydrate, (MACROBID) 100 MG capsule [[id number]]    Sent in to [facility] [address] [place]

## 2022-12-21 ENCOUNTER — Other Ambulatory Visit (HOSPITAL_COMMUNITY): Payer: Self-pay

## 2022-12-23 ENCOUNTER — Other Ambulatory Visit: Payer: Self-pay

## 2022-12-23 ENCOUNTER — Other Ambulatory Visit (HOSPITAL_COMMUNITY): Payer: Self-pay

## 2022-12-23 DIAGNOSIS — N3090 Cystitis, unspecified without hematuria: Secondary | ICD-10-CM

## 2022-12-23 MED ORDER — NITROFURANTOIN MONOHYD MACRO 100 MG PO CAPS: 100.0000 mg | ORAL_CAPSULE | Freq: Two times a day (BID) | ORAL | 0 refills | Status: AC

## 2022-12-23 MED ORDER — HYDROXYCHLOROQUINE SULFATE 200 MG PO TABS
200.0000 mg | ORAL_TABLET | Freq: Every day | ORAL | 0 refills | Status: DC
  Filled 2022-12-23: qty 90, 90d supply, fill #0

## 2022-12-23 NOTE — Telephone Encounter (Signed)
Sent to correct pharmacy

## 2022-12-24 ENCOUNTER — Other Ambulatory Visit (HOSPITAL_COMMUNITY): Payer: Self-pay

## 2022-12-24 LAB — URINALYSIS
Bilirubin, UA: NEGATIVE
Glucose, UA: NEGATIVE
Nitrite, UA: NEGATIVE
Specific Gravity, UA: >=1.03 — AB (ref 1.005–1.030)
Urobilinogen, Ur: 0.2 mg/dL (ref 0.2–1.0)
pH, UA: 5 (ref 5.0–7.5)

## 2022-12-25 ENCOUNTER — Other Ambulatory Visit (HOSPITAL_COMMUNITY): Payer: Self-pay

## 2023-01-10 ENCOUNTER — Other Ambulatory Visit (HOSPITAL_COMMUNITY): Payer: Self-pay

## 2023-01-10 ENCOUNTER — Other Ambulatory Visit: Payer: Self-pay

## 2023-01-15 ENCOUNTER — Encounter: Payer: Self-pay | Admitting: Adult Health

## 2023-01-15 ENCOUNTER — Ambulatory Visit: Payer: 59 | Admitting: Adult Health

## 2023-01-15 VITALS — BP 122/61 | HR 73 | Ht 62.0 in | Wt 125.5 lb

## 2023-01-15 DIAGNOSIS — Z1331 Encounter for screening for depression: Secondary | ICD-10-CM | POA: Diagnosis not present

## 2023-01-15 DIAGNOSIS — Z1211 Encounter for screening for malignant neoplasm of colon: Secondary | ICD-10-CM

## 2023-01-15 DIAGNOSIS — Z01419 Encounter for gynecological examination (general) (routine) without abnormal findings: Secondary | ICD-10-CM | POA: Diagnosis not present

## 2023-01-15 DIAGNOSIS — Z90710 Acquired absence of both cervix and uterus: Secondary | ICD-10-CM | POA: Diagnosis not present

## 2023-01-15 LAB — HEMOCCULT GUIAC POC 1CARD (OFFICE): Fecal Occult Blood, POC: NEGATIVE

## 2023-01-15 NOTE — Progress Notes (Signed)
Patient ID: Kara Bradley, female   DOB: 1965/11/19, 57 y.o.   MRN: [id number] History of Present Illness: Kara Bradley is a 57 year old white female,married, sp hysterectomy in for well woman gyn exam.  PCP is Dr Allena Katz.   Current Medications, Allergies, Past Medical History, Past Surgical History, Family History and Social History were reviewed in [employer] record.     Review of Systems: Patient denies any headaches, hearing loss, fatigue, blurred vision, shortness of breath, chest pain, abdominal pain, problems with bowel movements, urination, or intercourse. No joint pain or mood swings.     Physical Exam:BP 122/61 (BP Location: Left Arm, Patient Position: Sitting, Cuff Size: Normal)   Pulse 73   Ht 5\' 2"  (1.575 m)   Wt 125 lb 8 oz (56.9 kg)   BMI 22.95 kg/m   General:  Well developed, well nourished, no acute distress Skin:  Warm and dry Neck:  Midline trachea, normal thyroid, good ROM, no lymphadenopathy Lungs; Clear to auscultation bilaterally Breast:  No dominant palpable mass, retraction, or nipple discharge Cardiovascular: Regular rate and rhythm Abdomen:  Soft, non tender, no hepatosplenomegaly Pelvic:  External genitalia is normal in appearance, no lesions.  The vagina is pale. Urethra has no lesions or masses. The cervix and uterus are absent.  No adnexal masses or tenderness noted.Bladder is non tender, no masses felt. Rectal: Good sphincter tone, no polyps, or hemorrhoids felt.  Hemoccult negative. Extremities/musculoskeletal:  No swelling or varicosities noted, no clubbing or cyanosis Psych:  No mood changes, alert and cooperative,seems happy AA is 1    01/15/2023    3:40 PM 12/20/2022    1:57 PM 07/18/2022   10:43 AM  Depression screen PHQ 2/9  Decreased Interest 0 0 0  Down, Depressed, Hopeless 0 0 0  PHQ - 2 Score 0 0 0  Altered sleeping 1 0   Tired, decreased energy 1 0   Change in appetite 0 0   Feeling bad or failure about yourself   0 0   Trouble concentrating 0 0   Moving slowly or fidgety/restless 0 0   Suicidal thoughts 0    PHQ-9 Score 2 0   Difficult doing work/chores  Not difficult at all        01/15/2023    3:40 PM 12/20/2022    1:57 PM  GAD 7 : Generalized Anxiety Score  Nervous, Anxious, on Edge 0 0  Control/stop worrying 0 0  Worry too much - different things 0 0  Trouble relaxing 0 0  Restless 0 0  Easily annoyed or irritable 1 0  Afraid - awful might happen 0 0  Total GAD 7 Score 1 0  Anxiety Difficulty  Not difficult at all      Upstream - 01/15/23 1549       Pregnancy Intention Screening   Does the patient want to become pregnant in the next year? N/A    Does the patient's partner want to become pregnant in the next year? N/A    Would the patient like to discuss contraceptive options today? N/A      Contraception Wrap Up   Current Method Female Sterilization   HYST   End Method Female Sterilization   hyst   Contraception Counseling Provided No            Examination chaperoned by Malachy Mood LPN   Impression and plan: 1. Encounter for well woman exam with routine gynecological exam Physical with PCP GYN physical  in 5 year Mammogram was negative 02/14/22 Colonoscopy per [facility] with PCP  2. Encounter for screening fecal occult blood testing Hemoccult was negative   3. S/P hysterectomy

## 2023-02-17 ENCOUNTER — Other Ambulatory Visit (HOSPITAL_COMMUNITY): Payer: Self-pay | Admitting: Internal Medicine

## 2023-02-17 DIAGNOSIS — Z1231 Encounter for screening mammogram for malignant neoplasm of breast: Secondary | ICD-10-CM

## 2023-02-19 ENCOUNTER — Ambulatory Visit (HOSPITAL_COMMUNITY)
Admission: RE | Admit: 2023-02-19 | Discharge: 2023-02-19 | Disposition: A | Discharge status: Home / Self Care | Payer: 59 | Source: Ambulatory Visit | Attending: Internal Medicine | Admitting: Internal Medicine

## 2023-02-19 ENCOUNTER — Inpatient Hospital Stay (HOSPITAL_COMMUNITY): Admission: RE | Admit: 2023-02-19 | Payer: 59 | Source: Ambulatory Visit

## 2023-02-19 ENCOUNTER — Encounter (HOSPITAL_COMMUNITY): Payer: Self-pay

## 2023-02-19 DIAGNOSIS — Z1231 Encounter for screening mammogram for malignant neoplasm of breast: Secondary | ICD-10-CM | POA: Insufficient documentation

## 2023-02-21 ENCOUNTER — Telehealth: Payer: 59 | Admitting: Family

## 2023-02-21 DIAGNOSIS — J019 Acute sinusitis, unspecified: Secondary | ICD-10-CM | POA: Diagnosis not present

## 2023-02-21 DIAGNOSIS — B9689 Other specified bacterial agents as the cause of diseases classified elsewhere: Secondary | ICD-10-CM | POA: Diagnosis not present

## 2023-02-21 MED ORDER — AMOXICILLIN-POT CLAVULANATE 875-125 MG PO TABS: 1.0000 | ORAL_TABLET | Freq: Two times a day (BID) | ORAL | 0 refills | Status: DC

## 2023-02-21 NOTE — Progress Notes (Signed)

## 2023-03-05 ENCOUNTER — Encounter (HOSPITAL_COMMUNITY): Payer: Self-pay

## 2023-03-05 ENCOUNTER — Other Ambulatory Visit: Payer: Self-pay | Admitting: Pharmacist

## 2023-03-05 ENCOUNTER — Ambulatory Visit: Payer: 59 | Attending: Internal Medicine | Admitting: Pharmacist

## 2023-03-05 DIAGNOSIS — Z7189 Other specified counseling: Secondary | ICD-10-CM

## 2023-03-05 NOTE — Progress Notes (Signed)
S:  Patient presents for review of their specialty medication therapy.  Patient is currently taking Prolia for low bone density. Patient is managed by Dr. Kathi Ludwig for this.   Adherence: confirms.   Efficacy: reports that Dr. Kathi Ludwig is pleased with results. Has another dose n Feb.  Dosing: 60 mg q25months  Dose adjustments: Renal: Monitor patients with severe impairment (CrCl <30 mL/minute or on dialysis) closely, as significant and prolonged hypocalcemia (incidence of 29% and potentially lasting weeks to months) and marked elevations of serum parathyroid hormone are serious risks in this population. Ensure adequate calcium and vitamin D intake/supplementation. CrCl >=30 mL/minute: No dosage adjustment necessary. CrCl <30 mL/minute: No dosage adjustment necessary; use in conjunction with guidance from patient's nephrology team. Hepatic: no dose adjustments (has not been studied)  Monitoring: S/sx of infection: none  S/sx of hypersensitivity: none S/sx of hypocalcemia/hypercalcemia: none  Hip, thigh or groin pain: none Jaw pain: none  Side effects: Dermatitis/skin rash: none Peripheral edema: none HA: none GI upset: none  Other side effects: none reported   O:   Lab Results  Component Value Date   WBC 4.4 05/15/2022   HGB 13.6 05/15/2022   HCT 39.7 05/15/2022   MCV 95.2 05/15/2022   PLT 194 05/15/2022      Chemistry      Component Value Date/Time   NA 138 05/15/2022 0742   K 3.8 05/15/2022 0742   CL 103 05/15/2022 0742   CO2 26 05/15/2022 0742   BUN 18 05/15/2022 0742   CREATININE 0.77 05/15/2022 0742   CREATININE 0.75 08/17/2019 1546      Component Value Date/Time   CALCIUM 9.4 05/15/2022 0742   ALKPHOS 43 05/15/2022 0742   AST 27 05/15/2022 0742   ALT 27 05/15/2022 0742   BILITOT 0.7 05/15/2022 0742      A/P: 1. Medication review: Patient currently on Prolia for osteoporosis. Reviewed the medication with the patient, including the following: Prolia  (denosumab) is a monoclonal antibody with affinity for nuclear factor-kappa ligand (RANKL). Prolia binds to RANKL and prevents osteoclast formation, leading to decreased bone resorption and increased bone mass in osteoporosis. Patient educated on purpose, proper use, and potential adverse effects of Prolia. The most common adverse effects are hypersensitivities, peripheral edema, dermatitis/skin rash, GI upset, HA, joint pain, and infection. There is the possibility of atypical femur fracture, serum calcium disturbances, and osteonecrosis of the jaw. Prolia exists as a solution prefilled syringe for SQ administration. Administration: SubQ route only and should not be administered IV, IM, or intradermally. Prior to administration, bring to room temperature in original container (allow to stand ~15 to 30 minutes); do not warm by any other method. Solution may contain trace amounts of translucent to white protein particles; do not use if cloudy, discolored (normal solution should be clear and colorless to pale yellow), or contains excessive particles or foreign matter. Avoid vigorous shaking. Administer via SubQ injection in the upper arm, upper thigh, or abdomen; should only be administered by a health care professional. Pt reports her specialist plans to complete 5 years of therapy. No recommendations for any changes at this time.  Butch Penny, PharmD, Patsy Baltimore, CPP Clinical Pharmacist [facility] [phone]

## 2023-03-05 NOTE — Progress Notes (Signed)
Please see OV from 03/05/2023 for complete documentation.   Butch Penny, PharmD, Patsy Baltimore, CPP Clinical Pharmacist [facility] [phone]

## 2023-03-06 ENCOUNTER — Other Ambulatory Visit: Payer: Self-pay

## 2023-03-10 ENCOUNTER — Encounter (HOSPITAL_COMMUNITY): Payer: Self-pay

## 2023-03-10 ENCOUNTER — Other Ambulatory Visit: Payer: Self-pay | Admitting: Pharmacy Technician

## 2023-03-10 ENCOUNTER — Other Ambulatory Visit (HOSPITAL_COMMUNITY): Payer: Self-pay

## 2023-03-10 DIAGNOSIS — K121 Other forms of stomatitis: Secondary | ICD-10-CM | POA: Diagnosis not present

## 2023-03-10 DIAGNOSIS — M810 Age-related osteoporosis without current pathological fracture: Secondary | ICD-10-CM | POA: Diagnosis not present

## 2023-03-10 DIAGNOSIS — Z79899 Other long term (current) drug therapy: Secondary | ICD-10-CM | POA: Diagnosis not present

## 2023-03-10 DIAGNOSIS — L932 Other local lupus erythematosus: Secondary | ICD-10-CM | POA: Diagnosis not present

## 2023-03-10 NOTE — Progress Notes (Signed)
Specialty Pharmacy Refill Coordination Note  Kara Bradley is a 58 y.o. female contacted today regarding refills of specialty medication(s) Prolia  Patient requested (Patient-Rptd) Delivery   Delivery date: (Patient-Rptd) 04/08/23   Verified address: (Patient-Rptd) [address] [facility] [place] [address]  *[facility]*  Medication will be filled on 04/07/23.   Refill Request sent to MD & then Send to [facility] for Re-write

## 2023-03-11 ENCOUNTER — Other Ambulatory Visit: Payer: Self-pay | Admitting: Pharmacist

## 2023-03-11 ENCOUNTER — Other Ambulatory Visit: Payer: Self-pay

## 2023-03-11 MED ORDER — PROLIA 60 MG/ML ~~LOC~~ SOSY
PREFILLED_SYRINGE | SUBCUTANEOUS | 0 refills | Status: DC
  Filled 2023-03-11: qty 1, 180d supply, fill #0

## 2023-03-12 ENCOUNTER — Other Ambulatory Visit: Payer: Self-pay

## 2023-03-20 ENCOUNTER — Other Ambulatory Visit (HOSPITAL_COMMUNITY): Payer: Self-pay

## 2023-03-21 ENCOUNTER — Other Ambulatory Visit (HOSPITAL_COMMUNITY): Payer: Self-pay

## 2023-03-21 MED ORDER — HYDROXYCHLOROQUINE SULFATE 200 MG PO TABS
200.0000 mg | ORAL_TABLET | Freq: Every day | ORAL | 0 refills | Status: DC
  Filled 2023-03-21: qty 90, 90d supply, fill #0

## 2023-04-01 ENCOUNTER — Other Ambulatory Visit (HOSPITAL_COMMUNITY): Payer: Self-pay

## 2023-04-07 ENCOUNTER — Other Ambulatory Visit: Payer: Self-pay

## 2023-04-07 NOTE — Progress Notes (Signed)
 Pharmacy Patient Advocate Encounter   Received notification from Patient Pharmacy that prior authorization for Prolia is required/requested.   Insurance verification completed.   The patient is insured through [facility] .   Per test claim: PA required; PA submitted to above mentioned insurance via CoverMyMeds Key/confirmation #/EOC [facility] Status is pending

## 2023-04-07 NOTE — Progress Notes (Signed)
 Pharmacy Patient Advocate Encounter  Received notification from [facility] that Prior Authorization for Prolia has been APPROVED from 04/07/23 to 04/05/24   PA #/Case ID/Reference #: [id number]

## 2023-04-11 DIAGNOSIS — M810 Age-related osteoporosis without current pathological fracture: Secondary | ICD-10-CM | POA: Diagnosis not present

## 2023-05-19 ENCOUNTER — Encounter: Payer: Self-pay | Admitting: Internal Medicine

## 2023-05-20 ENCOUNTER — Other Ambulatory Visit: Payer: Self-pay | Admitting: Internal Medicine

## 2023-05-20 DIAGNOSIS — E782 Mixed hyperlipidemia: Secondary | ICD-10-CM

## 2023-05-20 DIAGNOSIS — M329 Systemic lupus erythematosus, unspecified: Secondary | ICD-10-CM

## 2023-05-20 DIAGNOSIS — N3010 Interstitial cystitis (chronic) without hematuria: Secondary | ICD-10-CM

## 2023-05-20 DIAGNOSIS — E559 Vitamin D deficiency, unspecified: Secondary | ICD-10-CM

## 2023-05-20 DIAGNOSIS — Z880 Allergy status to penicillin: Secondary | ICD-10-CM

## 2023-05-20 DIAGNOSIS — R739 Hyperglycemia, unspecified: Secondary | ICD-10-CM

## 2023-05-20 NOTE — Addendum Note (Signed)
 Addended byTrena Platt on: 05/20/2023 01:04 PM   Modules accepted: Orders

## 2023-05-23 ENCOUNTER — Other Ambulatory Visit (HOSPITAL_COMMUNITY)
Admission: RE | Admit: 2023-05-23 | Discharge: 2023-05-23 | Disposition: A | Discharge status: Home / Self Care | Source: Ambulatory Visit | Attending: Internal Medicine | Admitting: Internal Medicine

## 2023-05-23 DIAGNOSIS — E559 Vitamin D deficiency, unspecified: Secondary | ICD-10-CM | POA: Insufficient documentation

## 2023-05-23 DIAGNOSIS — M329 Systemic lupus erythematosus, unspecified: Secondary | ICD-10-CM | POA: Insufficient documentation

## 2023-05-23 DIAGNOSIS — R739 Hyperglycemia, unspecified: Secondary | ICD-10-CM | POA: Diagnosis not present

## 2023-05-23 DIAGNOSIS — E782 Mixed hyperlipidemia: Secondary | ICD-10-CM | POA: Insufficient documentation

## 2023-05-23 LAB — COMPREHENSIVE METABOLIC PANEL WITH GFR
ALT: 25 U/L (ref 0–44)
AST: 24 U/L (ref 15–41)
Albumin: 4.4 g/dL (ref 3.5–5.0)
Alkaline Phosphatase: 46 U/L (ref 38–126)
Anion gap: 11 (ref 5–15)
BUN: 15 mg/dL (ref 6–20)
CO2: 26 mmol/L (ref 22–32)
Calcium: 9.6 mg/dL (ref 8.9–10.3)
Chloride: 99 mmol/L (ref 98–111)
Creatinine, Ser: 0.72 mg/dL (ref 0.44–1.00)
GFR, Estimated: >60 mL/min (ref >60)
Glucose, Bld: 97 mg/dL (ref 70–99)
Potassium: 4.1 mmol/L (ref 3.5–5.1)
Sodium: 136 mmol/L (ref 135–145)
Total Bilirubin: 1 mg/dL (ref 0.0–1.2)
Total Protein: 8.3 g/dL — ABNORMAL HIGH (ref 6.5–8.1)

## 2023-05-23 LAB — CBC WITH DIFFERENTIAL/PLATELET
Abs Immature Granulocytes: 0.01 10*3/uL (ref 0.00–0.07)
Basophils Absolute: 0.1 10*3/uL (ref 0.0–0.1)
Basophils Relative: 1 %
Eosinophils Absolute: 0.2 10*3/uL (ref 0.0–0.5)
Eosinophils Relative: 4 %
HCT: 42 % (ref 36.0–46.0)
Hemoglobin: 14.2 g/dL (ref 12.0–15.0)
Immature Granulocytes: 0 %
Lymphocytes Relative: 29 %
Lymphs Abs: 1.1 10*3/uL (ref 0.7–4.0)
MCH: 32.2 pg (ref 26.0–34.0)
MCHC: 33.8 g/dL (ref 30.0–36.0)
MCV: 95.2 fL (ref 80.0–100.0)
Monocytes Absolute: 0.4 10*3/uL (ref 0.1–1.0)
Monocytes Relative: 9 %
Neutro Abs: 2.1 10*3/uL (ref 1.7–7.7)
Neutrophils Relative %: 57 %
Platelets: 230 10*3/uL (ref 150–400)
RBC: 4.41 MIL/uL (ref 3.87–5.11)
RDW: 11.9 % (ref 11.5–15.5)
WBC: 3.7 10*3/uL — ABNORMAL LOW (ref 4.0–10.5)
nRBC: 0 % (ref 0.0–0.2)

## 2023-05-23 LAB — LIPID PANEL
Cholesterol: 198 mg/dL (ref 0–200)
HDL: 89 mg/dL (ref >40)
LDL Cholesterol: 98 mg/dL (ref 0–99)
Total CHOL/HDL Ratio: 2.2 ratio
Triglycerides: 56 mg/dL (ref <150)
VLDL: 11 mg/dL (ref 0–40)

## 2023-05-23 LAB — TSH: TSH: 3.51 u[IU]/mL (ref 0.350–4.500)

## 2023-05-23 LAB — VITAMIN D 25 HYDROXY (VIT D DEFICIENCY, FRACTURES): Vit D, 25-Hydroxy: 56.91 ng/mL (ref 30–100)

## 2023-05-23 LAB — HEMOGLOBIN A1C
Hgb A1c MFr Bld: 4.8 % (ref 4.8–5.6)
Mean Plasma Glucose: 91.06 mg/dL

## 2023-05-27 ENCOUNTER — Ambulatory Visit (INDEPENDENT_AMBULATORY_CARE_PROVIDER_SITE_OTHER): Payer: 59 | Admitting: Internal Medicine

## 2023-05-27 ENCOUNTER — Encounter: Payer: Self-pay | Admitting: Internal Medicine

## 2023-05-27 VITALS — BP 95/69 | HR 83 | Ht 62.0 in | Wt 126.6 lb

## 2023-05-27 DIAGNOSIS — Z23 Encounter for immunization: Secondary | ICD-10-CM | POA: Diagnosis not present

## 2023-05-27 DIAGNOSIS — M810 Age-related osteoporosis without current pathological fracture: Secondary | ICD-10-CM | POA: Diagnosis not present

## 2023-05-27 DIAGNOSIS — Z0001 Encounter for general adult medical examination with abnormal findings: Secondary | ICD-10-CM | POA: Diagnosis not present

## 2023-05-27 DIAGNOSIS — M329 Systemic lupus erythematosus, unspecified: Secondary | ICD-10-CM | POA: Diagnosis not present

## 2023-05-27 DIAGNOSIS — J309 Allergic rhinitis, unspecified: Secondary | ICD-10-CM | POA: Diagnosis not present
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
## 2023-05-27 NOTE — Assessment & Plan Note (Signed)
 Chronic nasal congestion likely due to allergic sinusitis Continue Nasacort and as needed Claritin Advised to wear mask during outdoor activities especially during high pollen exposure Nasal saline rinse as tolerated

## 2023-05-27 NOTE — Assessment & Plan Note (Signed)
Well-controlled with Plaquenil ?Followed by rheumatology and dermatology ?Sees Ophthalmology for retina exam as she is on Plaquenil ?

## 2023-05-27 NOTE — Assessment & Plan Note (Signed)
She gets Prolia from Rheumatology office ?On Caltrate 600 plus D3 supplement ?H/o chronic steroids for SLE in the past ?

## 2023-05-27 NOTE — Assessment & Plan Note (Signed)
Physical exam as documented. Blood tests reviewed and discussed with the patient in detail. 

## 2023-05-27 NOTE — Patient Instructions (Signed)
 Please continue to take medications as prescribed.  Please continue to follow heart healthy diet and perform moderate exercise/walking at least 150 mins/week.

## 2023-06-20 ENCOUNTER — Other Ambulatory Visit (HOSPITAL_COMMUNITY): Payer: Self-pay

## 2023-06-23 ENCOUNTER — Other Ambulatory Visit (HOSPITAL_COMMUNITY): Payer: Self-pay

## 2023-06-23 MED ORDER — HYDROXYCHLOROQUINE SULFATE 200 MG PO TABS
200.0000 mg | ORAL_TABLET | Freq: Every day | ORAL | 0 refills | Status: DC
  Filled 2023-06-23: qty 90, 90d supply, fill #0

## 2023-08-18 ENCOUNTER — Telehealth: Admitting: Physician Assistant

## 2023-08-18 DIAGNOSIS — R3989 Other symptoms and signs involving the genitourinary system: Secondary | ICD-10-CM

## 2023-08-18 MED ORDER — NITROFURANTOIN MONOHYD MACRO 100 MG PO CAPS: 100.0000 mg | ORAL_CAPSULE | Freq: Two times a day (BID) | ORAL | 0 refills | Status: AC

## 2023-08-18 NOTE — Progress Notes (Signed)

## 2023-09-08 ENCOUNTER — Other Ambulatory Visit (HOSPITAL_COMMUNITY): Payer: Self-pay

## 2023-09-08 ENCOUNTER — Other Ambulatory Visit: Payer: Self-pay

## 2023-09-08 DIAGNOSIS — L932 Other local lupus erythematosus: Secondary | ICD-10-CM | POA: Diagnosis not present

## 2023-09-08 DIAGNOSIS — Z79899 Other long term (current) drug therapy: Secondary | ICD-10-CM | POA: Diagnosis not present

## 2023-09-08 DIAGNOSIS — M810 Age-related osteoporosis without current pathological fracture: Secondary | ICD-10-CM | POA: Diagnosis not present

## 2023-09-08 DIAGNOSIS — E559 Vitamin D deficiency, unspecified: Secondary | ICD-10-CM | POA: Diagnosis not present

## 2023-09-08 DIAGNOSIS — M329 Systemic lupus erythematosus, unspecified: Secondary | ICD-10-CM | POA: Diagnosis not present

## 2023-09-08 DIAGNOSIS — Z880 Allergy status to penicillin: Secondary | ICD-10-CM | POA: Diagnosis not present

## 2023-09-08 DIAGNOSIS — R739 Hyperglycemia, unspecified: Secondary | ICD-10-CM | POA: Diagnosis not present

## 2023-09-08 DIAGNOSIS — E782 Mixed hyperlipidemia: Secondary | ICD-10-CM | POA: Diagnosis not present

## 2023-09-08 MED ORDER — HYDROXYCHLOROQUINE SULFATE 200 MG PO TABS
200.0000 mg | ORAL_TABLET | Freq: Every day | ORAL | 1 refills | Status: DC
  Filled 2023-09-16: qty 90, 90d supply, fill #0
  Filled 2023-12-15: qty 90, 90d supply, fill #1

## 2023-09-09 LAB — CBC WITH DIFFERENTIAL/PLATELET
Basophils Absolute: 0.1 x10E3/uL (ref 0.0–0.2)
Basos: 1 %
EOS (ABSOLUTE): 0.1 x10E3/uL (ref 0.0–0.4)
Eos: 2 %
Hematocrit: 42.3 % (ref 34.0–46.6)
Hemoglobin: 13.7 g/dL (ref 11.1–15.9)
Immature Grans (Abs): 0 x10E3/uL (ref 0.0–0.1)
Immature Granulocytes: 0 %
Lymphocytes Absolute: 1.3 x10E3/uL (ref 0.7–3.1)
Lymphs: 24 %
MCH: 31.6 pg (ref 26.6–33.0)
MCHC: 32.4 g/dL (ref 31.5–35.7)
MCV: 98 fL — ABNORMAL HIGH (ref 79–97)
Monocytes Absolute: 0.5 x10E3/uL (ref 0.1–0.9)
Monocytes: 9 %
Neutrophils Absolute: 3.5 x10E3/uL (ref 1.4–7.0)
Neutrophils: 64 %
Platelets: 203 x10E3/uL (ref 150–450)
RBC: 4.34 x10E6/uL (ref 3.77–5.28)
RDW: 11.8 % (ref 11.7–15.4)
WBC: 5.4 x10E3/uL (ref 3.4–10.8)

## 2023-09-09 LAB — CMP14+EGFR
ALT: 21 IU/L (ref 0–32)
AST: 23 IU/L (ref 0–40)
Albumin: 4.3 g/dL (ref 3.8–4.9)
Alkaline Phosphatase: 50 IU/L (ref 44–121)
BUN/Creatinine Ratio: 15 (ref 9–23)
BUN: 12 mg/dL (ref 6–24)
Bilirubin Total: 0.3 mg/dL (ref 0.0–1.2)
CO2: 22 mmol/L (ref 20–29)
Calcium: 9.7 mg/dL (ref 8.7–10.2)
Chloride: 100 mmol/L (ref 96–106)
Creatinine, Ser: 0.78 mg/dL (ref 0.57–1.00)
Globulin, Total: 2.6 g/dL (ref 1.5–4.5)
Glucose: 84 mg/dL (ref 70–99)
Potassium: 4.3 mmol/L (ref 3.5–5.2)
Sodium: 139 mmol/L (ref 134–144)
Total Protein: 6.9 g/dL (ref 6.0–8.5)
eGFR: 90 mL/min/1.73 (ref >59)

## 2023-09-09 LAB — LIPID PANEL
Chol/HDL Ratio: 2.5 ratio (ref 0.0–4.4)
Cholesterol, Total: 175 mg/dL (ref 100–199)
HDL: 69 mg/dL (ref >39)
LDL Chol Calc (NIH): 88 mg/dL (ref 0–99)
Triglycerides: 103 mg/dL (ref 0–149)
VLDL Cholesterol Cal: 18 mg/dL (ref 5–40)

## 2023-09-09 LAB — TSH: TSH: 3.4 u[IU]/mL (ref 0.450–4.500)

## 2023-09-09 LAB — HEMOGLOBIN A1C
Est. average glucose Bld gHb Est-mCnc: 105 mg/dL
Hgb A1c MFr Bld: 5.3 % (ref 4.8–5.6)

## 2023-09-09 LAB — VITAMIN D 25 HYDROXY (VIT D DEFICIENCY, FRACTURES): Vit D, 25-Hydroxy: 51.7 ng/mL (ref 30.0–100.0)

## 2023-09-14 ENCOUNTER — Ambulatory Visit: Payer: Self-pay | Admitting: Internal Medicine

## 2023-09-16 ENCOUNTER — Other Ambulatory Visit (HOSPITAL_COMMUNITY): Payer: Self-pay

## 2023-09-16 ENCOUNTER — Other Ambulatory Visit: Payer: Self-pay

## 2023-09-24 ENCOUNTER — Other Ambulatory Visit (HOSPITAL_COMMUNITY): Payer: Self-pay

## 2023-09-24 ENCOUNTER — Other Ambulatory Visit: Payer: Self-pay | Admitting: Pharmacist

## 2023-09-24 ENCOUNTER — Other Ambulatory Visit: Payer: Self-pay

## 2023-09-24 MED ORDER — PROLIA 60 MG/ML ~~LOC~~ SOSY
PREFILLED_SYRINGE | SUBCUTANEOUS | 1 refills | Status: AC
  Filled 2023-09-24 (×2): qty 1, 180d supply, fill #0
  Filled 2023-09-24 (×2): qty 1, fill #0
  Filled 2024-03-12: qty 1, 30d supply, fill #1

## 2023-09-24 MED ORDER — PROLIA 60 MG/ML ~~LOC~~ SOSY: PREFILLED_SYRINGE | SUBCUTANEOUS | 1 refills | Status: DC

## 2023-09-24 NOTE — Progress Notes (Signed)
 Specialty Pharmacy Refill Coordination Note  Kara Bradley is a 58 y.o. female contacted today regarding refills of specialty medication(s) Denosumab  (Prolia )   Patient requested Courier to Provider Office   Delivery date: 10/06/23   Verified address: [facility] [address] [place] [address]   Medication will be filled on 10/03/23.

## 2023-10-03 ENCOUNTER — Other Ambulatory Visit: Payer: Self-pay

## 2023-10-08 ENCOUNTER — Other Ambulatory Visit: Payer: Self-pay | Admitting: Internal Medicine

## 2023-10-08 DIAGNOSIS — N3010 Interstitial cystitis (chronic) without hematuria: Secondary | ICD-10-CM

## 2023-10-08 DIAGNOSIS — N952 Postmenopausal atrophic vaginitis: Secondary | ICD-10-CM

## 2023-10-08 MED ORDER — ESTRADIOL 0.1 MG/GM VA CREA
TOPICAL_CREAM | VAGINAL | 12 refills | Status: AC
Start: 2023-10-08 — End: ?

## 2023-10-10 ENCOUNTER — Other Ambulatory Visit (HOSPITAL_COMMUNITY)
Admission: RE | Admit: 2023-10-10 | Discharge: 2023-10-10 | Disposition: A | Discharge status: Home / Self Care | Source: Ambulatory Visit

## 2023-10-10 DIAGNOSIS — Z79899 Other long term (current) drug therapy: Secondary | ICD-10-CM | POA: Diagnosis not present

## 2023-10-10 DIAGNOSIS — M810 Age-related osteoporosis without current pathological fracture: Secondary | ICD-10-CM | POA: Diagnosis not present

## 2023-10-10 DIAGNOSIS — L930 Discoid lupus erythematosus: Secondary | ICD-10-CM | POA: Insufficient documentation

## 2023-10-10 LAB — CBC WITH DIFFERENTIAL/PLATELET
Abs Immature Granulocytes: 0.01 K/uL (ref 0.00–0.07)
Basophils Absolute: 0 K/uL (ref 0.0–0.1)
Basophils Relative: 1 %
Eosinophils Absolute: 0.1 K/uL (ref 0.0–0.5)
Eosinophils Relative: 2 %
HCT: 41 % (ref 36.0–46.0)
Hemoglobin: 13.8 g/dL (ref 12.0–15.0)
Immature Granulocytes: 0 %
Lymphocytes Relative: 28 %
Lymphs Abs: 1.2 K/uL (ref 0.7–4.0)
MCH: 32.2 pg (ref 26.0–34.0)
MCHC: 33.7 g/dL (ref 30.0–36.0)
MCV: 95.8 fL (ref 80.0–100.0)
Monocytes Absolute: 0.4 K/uL (ref 0.1–1.0)
Monocytes Relative: 10 %
Neutro Abs: 2.4 K/uL (ref 1.7–7.7)
Neutrophils Relative %: 59 %
Platelets: 226 K/uL (ref 150–400)
RBC: 4.28 MIL/uL (ref 3.87–5.11)
RDW: 11.9 % (ref 11.5–15.5)
WBC: 4.2 K/uL (ref 4.0–10.5)
nRBC: 0 % (ref 0.0–0.2)

## 2023-10-10 LAB — PROTEIN / CREATININE RATIO, URINE
Creatinine, Urine: 35 mg/dL
Protein Creatinine Ratio: 0.17 mg/mg{creat} — ABNORMAL HIGH (ref 0.00–0.15)
Total Protein, Urine: 6 mg/dL

## 2023-10-10 LAB — CALCIUM: Calcium: 9.7 mg/dL (ref 8.9–10.3)

## 2023-10-10 LAB — C-REACTIVE PROTEIN: CRP: <0.5 mg/dL (ref <1.0)

## 2023-10-10 LAB — CREATININE, SERUM
Creatinine, Ser: 0.77 mg/dL (ref 0.44–1.00)
GFR, Estimated: >60 mL/min (ref >60)

## 2023-10-11 LAB — C3 COMPLEMENT: C3 Complement: 141 mg/dL (ref 82–167)

## 2023-10-11 LAB — ANTI-DNA ANTIBODY, DOUBLE-STRANDED: ds DNA Ab: 1 [IU]/mL (ref 0–9)

## 2023-10-11 LAB — C4 COMPLEMENT: Complement C4, Body Fluid: 15 mg/dL (ref 12–38)

## 2023-10-18 ENCOUNTER — Encounter (HOSPITAL_COMMUNITY): Payer: Self-pay

## 2023-10-20 ENCOUNTER — Other Ambulatory Visit: Payer: Self-pay

## 2023-10-20 ENCOUNTER — Other Ambulatory Visit (HOSPITAL_COMMUNITY): Payer: Self-pay

## 2023-11-13 DIAGNOSIS — H01005 Unspecified blepharitis left lower eyelid: Secondary | ICD-10-CM | POA: Diagnosis not present

## 2023-11-13 DIAGNOSIS — Z1283 Encounter for screening for malignant neoplasm of skin: Secondary | ICD-10-CM | POA: Diagnosis not present

## 2023-11-13 DIAGNOSIS — L2989 Other pruritus: Secondary | ICD-10-CM | POA: Diagnosis not present

## 2023-11-18 ENCOUNTER — Other Ambulatory Visit (HOSPITAL_COMMUNITY): Payer: Self-pay

## 2023-11-18 ENCOUNTER — Other Ambulatory Visit: Payer: Self-pay

## 2023-11-18 MED ORDER — TRIAMCINOLONE ACETONIDE 0.1 % EX CREA
TOPICAL_CREAM | Freq: Two times a day (BID) | CUTANEOUS | 3 refills | Status: AC | PRN
  Filled 2023-11-18: qty 60, 30d supply, fill #0

## 2023-12-08 DIAGNOSIS — H2513 Age-related nuclear cataract, bilateral: Secondary | ICD-10-CM | POA: Diagnosis not present

## 2023-12-08 DIAGNOSIS — M329 Systemic lupus erythematosus, unspecified: Secondary | ICD-10-CM | POA: Diagnosis not present

## 2023-12-08 DIAGNOSIS — Z79899 Other long term (current) drug therapy: Secondary | ICD-10-CM | POA: Diagnosis not present

## 2023-12-08 LAB — OPHTHALMOLOGY REPORT-SCANNED

## 2023-12-15 ENCOUNTER — Other Ambulatory Visit (HOSPITAL_BASED_OUTPATIENT_CLINIC_OR_DEPARTMENT_OTHER): Payer: Self-pay

## 2024-01-06 ENCOUNTER — Other Ambulatory Visit (HOSPITAL_COMMUNITY): Payer: Self-pay | Admitting: Internal Medicine

## 2024-01-06 DIAGNOSIS — Z1231 Encounter for screening mammogram for malignant neoplasm of breast: Secondary | ICD-10-CM

## 2024-01-26 ENCOUNTER — Other Ambulatory Visit (HOSPITAL_COMMUNITY): Payer: Self-pay

## 2024-01-26 DIAGNOSIS — D485 Neoplasm of uncertain behavior of skin: Secondary | ICD-10-CM | POA: Diagnosis not present

## 2024-01-26 DIAGNOSIS — M329 Systemic lupus erythematosus, unspecified: Secondary | ICD-10-CM | POA: Diagnosis not present

## 2024-01-26 DIAGNOSIS — H0279 Other degenerative disorders of eyelid and periocular area: Secondary | ICD-10-CM | POA: Diagnosis not present

## 2024-01-26 DIAGNOSIS — H01125 Discoid lupus erythematosus of left lower eyelid: Secondary | ICD-10-CM | POA: Diagnosis not present

## 2024-01-26 DIAGNOSIS — Z01818 Encounter for other preprocedural examination: Secondary | ICD-10-CM | POA: Diagnosis not present

## 2024-01-26 MED ORDER — PREDNISOLONE ACETATE 1 % OP SUSP
1.0000 [drp] | Freq: Three times a day (TID) | OPHTHALMIC | 0 refills | Status: AC
  Filled 2024-01-26: qty 5, 21d supply, fill #0

## 2024-02-23 ENCOUNTER — Ambulatory Visit (HOSPITAL_COMMUNITY)
Admission: RE | Admit: 2024-02-23 | Discharge: 2024-02-23 | Disposition: A | Discharge status: Home / Self Care | Source: Ambulatory Visit | Attending: Internal Medicine | Admitting: Internal Medicine

## 2024-02-23 DIAGNOSIS — Z1231 Encounter for screening mammogram for malignant neoplasm of breast: Secondary | ICD-10-CM | POA: Diagnosis present

## 2024-02-25 ENCOUNTER — Encounter: Payer: Self-pay | Admitting: Internal Medicine

## 2024-02-27 ENCOUNTER — Telehealth: Payer: Self-pay | Admitting: Pharmacist

## 2024-02-27 NOTE — Telephone Encounter (Signed)
 Called patient to schedule an appointment for the [facility] Specialty Medication [facility]. I was unable to reach the patient so I left a HIPAA-compliant message requesting that the patient return my call.   Herlene Fleeta Morris, PharmD, JAQUELINE, CPP Clinical Pharmacist [facility] & [facility] [phone]

## 2024-03-02 ENCOUNTER — Ambulatory Visit: Attending: Internal Medicine | Admitting: Pharmacist

## 2024-03-02 ENCOUNTER — Encounter: Payer: Self-pay | Admitting: Pharmacist

## 2024-03-02 DIAGNOSIS — Z7189 Other specified counseling: Secondary | ICD-10-CM

## 2024-03-02 NOTE — Progress Notes (Signed)
 S:  Patient presents for review of their specialty medication therapy.  Patient is currently taking Prolia  for low bone density. Patient is managed by Dr. Mikel for this.   Adherence: confirms.   Efficacy: reports that she is pleased with results. Recent BMD showed improvement.   Dosing: 60 mg q6months  Dose adjustments: Renal: Monitor patients with severe impairment (CrCl <30 mL/minute or on dialysis) closely, as significant and prolonged hypocalcemia (incidence of 29% and potentially lasting weeks to months) and marked elevations of serum parathyroid hormone are serious risks in this population. Ensure adequate calcium and vitamin D  intake/supplementation. CrCl >=30 mL/minute: No dosage adjustment necessary. CrCl <30 mL/minute: No dosage adjustment necessary; use in conjunction with guidance from patient's nephrology team. Hepatic: no dose adjustments (has not been studied)  Monitoring: S/sx of infection: none  S/sx of hypersensitivity: none S/sx of hypocalcemia/hypercalcemia: none  Hip, thigh or groin pain: none Jaw pain: none  Side effects: Dermatitis/skin rash: none Peripheral edema: none HA: none GI upset: none  Other side effects: none reported   O:   Lab Results  Component Value Date   WBC 4.2 10/10/2023   HGB 13.8 10/10/2023   HCT 41.0 10/10/2023   MCV 95.8 10/10/2023   PLT 226 10/10/2023      Chemistry      Component Value Date/Time   NA 139 09/08/2023 0959   K 4.3 09/08/2023 0959   CL 100 09/08/2023 0959   CO2 22 09/08/2023 0959   BUN 12 09/08/2023 0959   CREATININE 0.77 10/10/2023 0919   CREATININE 0.75 08/17/2019 1546      Component Value Date/Time   CALCIUM 9.7 10/10/2023 0919   ALKPHOS 50 09/08/2023 0959   AST 23 09/08/2023 0959   ALT 21 09/08/2023 0959   BILITOT 0.3 09/08/2023 0959      A/P: 1. Medication review: Patient currently on Prolia  for osteoporosis. Reviewed the medication with the patient, including the following: Prolia   (denosumab ) is a monoclonal antibody with affinity for nuclear factor-kappa ligand (RANKL). Prolia  binds to RANKL and prevents osteoclast formation, leading to decreased bone resorption and increased bone mass in osteoporosis. Patient educated on purpose, proper use, and potential adverse effects of Prolia . The most common adverse effects are hypersensitivities, peripheral edema, dermatitis/skin rash, GI upset, HA, joint pain, and infection. There is the possibility of atypical femur fracture, serum calcium disturbances, and osteonecrosis of the jaw. Prolia  exists as a solution prefilled syringe for SQ administration. Administration: SubQ route only and should not be administered IV, IM, or intradermally. Prior to administration, bring to room temperature in original container (allow to stand ~15 to 30 minutes); do not warm by any other method. Solution may contain trace amounts of translucent to white protein particles; do not use if cloudy, discolored (normal solution should be clear and colorless to pale yellow), or contains excessive particles or foreign matter. Avoid vigorous shaking. Administer via SubQ injection in the upper arm, upper thigh, or abdomen; should only be administered by a health care professional. Pt reports her specialist plans to complete 5 years of therapy. No recommendations for any changes at this time.  Herlene Fleeta Morris, PharmD, JAQUELINE, CPP Clinical Pharmacist [facility] & [facility] [phone]

## 2024-03-04 ENCOUNTER — Other Ambulatory Visit: Payer: Self-pay

## 2024-03-04 ENCOUNTER — Other Ambulatory Visit (HOSPITAL_COMMUNITY): Payer: Self-pay

## 2024-03-04 MED ORDER — TOBRAMYCIN-DEXAMETHASONE 0.3-0.1 % OP SUSP
OPHTHALMIC | 1 refills | Status: AC
  Filled 2024-03-04: qty 5, 25d supply, fill #0

## 2024-03-10 ENCOUNTER — Other Ambulatory Visit: Payer: Self-pay

## 2024-03-10 ENCOUNTER — Other Ambulatory Visit (HOSPITAL_COMMUNITY): Payer: Self-pay

## 2024-03-10 ENCOUNTER — Other Ambulatory Visit (HOSPITAL_BASED_OUTPATIENT_CLINIC_OR_DEPARTMENT_OTHER): Payer: Self-pay

## 2024-03-10 ENCOUNTER — Other Ambulatory Visit (HOSPITAL_COMMUNITY)
Admission: RE | Admit: 2024-03-10 | Discharge: 2024-03-10 | Disposition: A | Discharge status: Home / Self Care | Source: Ambulatory Visit

## 2024-03-10 DIAGNOSIS — L932 Other local lupus erythematosus: Secondary | ICD-10-CM | POA: Diagnosis present

## 2024-03-10 LAB — CREATININE, URINE, RANDOM: Creatinine, Urine: 45 mg/dL

## 2024-03-10 LAB — URINALYSIS, COMPLETE (UACMP) WITH MICROSCOPIC
Bacteria, UA: NONE SEEN
Bilirubin Urine: NEGATIVE
Glucose, UA: NEGATIVE mg/dL
Hgb urine dipstick: NEGATIVE
Ketones, ur: NEGATIVE mg/dL
Nitrite: NEGATIVE
Protein, ur: NEGATIVE mg/dL
Specific Gravity, Urine: 1.008 (ref 1.005–1.030)
pH: 7 (ref 5.0–8.0)

## 2024-03-10 LAB — HEPATIC FUNCTION PANEL
ALT: 27 U/L (ref 0–44)
AST: 26 U/L (ref 15–41)
Albumin: 4.7 g/dL (ref 3.5–5.0)
Alkaline Phosphatase: 56 U/L (ref 38–126)
Bilirubin, Direct: 0.1 mg/dL (ref 0.0–0.2)
Indirect Bilirubin: 0.1 mg/dL — ABNORMAL LOW (ref 0.3–0.9)
Total Bilirubin: 0.3 mg/dL (ref 0.0–1.2)
Total Protein: 7.8 g/dL (ref 6.5–8.1)

## 2024-03-10 LAB — CALCIUM: Calcium: 10 mg/dL (ref 8.9–10.3)

## 2024-03-10 LAB — VITAMIN D 25 HYDROXY (VIT D DEFICIENCY, FRACTURES): Vit D, 25-Hydroxy: 57.8 ng/mL (ref 30–100)

## 2024-03-10 LAB — CBC WITH DIFFERENTIAL/PLATELET
Abs Immature Granulocytes: 0.01 10*3/uL (ref 0.00–0.07)
Basophils Absolute: 0.1 10*3/uL (ref 0.0–0.1)
Basophils Relative: 1 %
Eosinophils Absolute: 0.1 10*3/uL (ref 0.0–0.5)
Eosinophils Relative: 3 %
HCT: 41.6 % (ref 36.0–46.0)
Hemoglobin: 13.9 g/dL (ref 12.0–15.0)
Immature Granulocytes: 0 %
Lymphocytes Relative: 24 %
Lymphs Abs: 1.3 10*3/uL (ref 0.7–4.0)
MCH: 31.7 pg (ref 26.0–34.0)
MCHC: 33.4 g/dL (ref 30.0–36.0)
MCV: 95 fL (ref 80.0–100.0)
Monocytes Absolute: 0.4 10*3/uL (ref 0.1–1.0)
Monocytes Relative: 8 %
Neutro Abs: 3.5 10*3/uL (ref 1.7–7.7)
Neutrophils Relative %: 64 %
Platelets: 242 10*3/uL (ref 150–400)
RBC: 4.38 MIL/uL (ref 3.87–5.11)
RDW: 11.8 % (ref 11.5–15.5)
WBC: 5.5 10*3/uL (ref 4.0–10.5)
nRBC: 0 % (ref 0.0–0.2)

## 2024-03-10 LAB — SEDIMENTATION RATE: Sed Rate: 11 mm/h (ref 0–30)

## 2024-03-10 LAB — C-REACTIVE PROTEIN: CRP: <0.5 mg/dL

## 2024-03-10 LAB — CREATININE, SERUM
Creatinine, Ser: 0.75 mg/dL (ref 0.44–1.00)
GFR, Estimated: >60 mL/min

## 2024-03-10 MED ORDER — HYDROXYCHLOROQUINE SULFATE 200 MG PO TABS
200.0000 mg | ORAL_TABLET | Freq: Every day | ORAL | 1 refills | Status: AC
  Filled 2024-03-10: qty 90, 90d supply, fill #0

## 2024-03-11 ENCOUNTER — Other Ambulatory Visit (HOSPITAL_COMMUNITY): Payer: Self-pay

## 2024-03-11 LAB — PROTEIN / CREATININE RATIO, URINE
Creatinine, Urine: 44 mg/dL
Total Protein, Urine: <6 mg/dL

## 2024-03-11 LAB — C3 COMPLEMENT: C3 Complement: 131 mg/dL (ref 82–167)

## 2024-03-11 LAB — C4 COMPLEMENT: Complement C4, Body Fluid: 12 mg/dL (ref 12–38)

## 2024-03-11 LAB — ANTI-DNASE B ANTIBODY: Anti-DNAse-B: 361 U/mL — ABNORMAL HIGH (ref 0–120)

## 2024-03-12 ENCOUNTER — Other Ambulatory Visit: Payer: Self-pay

## 2024-03-12 ENCOUNTER — Telehealth: Payer: Self-pay

## 2024-03-12 LAB — ANTI-DNA ANTIBODY, DOUBLE-STRANDED: ds DNA Ab: 1 [IU]/mL (ref 0–9)

## 2024-03-12 NOTE — Progress Notes (Signed)
 PA started in new encounter. Status is pending

## 2024-03-12 NOTE — Telephone Encounter (Signed)
 Pharmacy Patient Advocate Encounter   Received notification from Pt Calls Messages that prior authorization for Prolia  is required/requested.   Insurance verification completed.   The patient is insured through [facility]   Per test claim: PA required; PA submitted to above mentioned insurance via Latent Key/confirmation #/EOC [id number] Status is pending

## 2024-03-15 ENCOUNTER — Other Ambulatory Visit: Payer: Self-pay

## 2024-03-15 NOTE — Telephone Encounter (Signed)
 Pharmacy Patient Advocate Encounter  Received notification from [employer] that Prior Authorization for Prolia  has been DENIED.  See denial reason below   PA #/Case ID/Reference #: [id number]  Plan prefers biosimilar-Stoboclo Denial letter sent to office and requested a new prescription for preferred medication

## 2024-03-16 ENCOUNTER — Other Ambulatory Visit: Payer: Self-pay

## 2024-03-17 ENCOUNTER — Other Ambulatory Visit (HOSPITAL_COMMUNITY): Payer: Self-pay

## 2024-03-18 ENCOUNTER — Other Ambulatory Visit: Payer: Self-pay

## 2024-06-02 ENCOUNTER — Encounter: Admitting: Internal Medicine
